# Patient Record
Sex: Female | Born: 1980 | Race: Black or African American | Hispanic: No | Marital: Single | Smoking: Former smoker
Health system: Southern US, Community
[De-identification: ages and names within clinical notes are randomized; demographics above are authoritative.]

## PROBLEM LIST (undated history)

## (undated) DIAGNOSIS — I319 Disease of pericardium, unspecified: Secondary | ICD-10-CM

## (undated) DIAGNOSIS — R51 Headache: Secondary | ICD-10-CM

## (undated) DIAGNOSIS — E119 Type 2 diabetes mellitus without complications: Secondary | ICD-10-CM

## (undated) DIAGNOSIS — G43909 Migraine, unspecified, not intractable, without status migrainosus: Secondary | ICD-10-CM

## (undated) DIAGNOSIS — E7800 Pure hypercholesterolemia, unspecified: Secondary | ICD-10-CM

## (undated) DIAGNOSIS — A419 Sepsis, unspecified organism: Secondary | ICD-10-CM

## (undated) DIAGNOSIS — E282 Polycystic ovarian syndrome: Secondary | ICD-10-CM

## (undated) DIAGNOSIS — L0291 Cutaneous abscess, unspecified: Secondary | ICD-10-CM

## (undated) HISTORY — PX: BIOPSY ENDOMETRIAL: PRO11

## (undated) HISTORY — PX: ABSCESS DRAINAGE: SHX1119

---

## 2006-03-18 ENCOUNTER — Emergency Department (HOSPITAL_COMMUNITY): Admission: EM | Admit: 2006-03-18 | Discharge: 2006-03-18 | Payer: Self-pay | Admitting: Emergency Medicine

## 2006-07-30 ENCOUNTER — Emergency Department (HOSPITAL_COMMUNITY): Admission: EM | Admit: 2006-07-30 | Discharge: 2006-07-30 | Payer: Self-pay | Admitting: Emergency Medicine

## 2007-02-25 ENCOUNTER — Emergency Department (HOSPITAL_COMMUNITY): Admission: EM | Admit: 2007-02-25 | Discharge: 2007-02-25 | Payer: Self-pay | Admitting: Emergency Medicine

## 2007-02-28 ENCOUNTER — Emergency Department (HOSPITAL_COMMUNITY): Admission: EM | Admit: 2007-02-28 | Discharge: 2007-02-28 | Payer: Self-pay | Admitting: Family Medicine

## 2007-05-31 ENCOUNTER — Emergency Department (HOSPITAL_COMMUNITY): Admission: EM | Admit: 2007-05-31 | Discharge: 2007-05-31 | Payer: Self-pay | Admitting: Emergency Medicine

## 2007-11-22 ENCOUNTER — Emergency Department (HOSPITAL_COMMUNITY): Admission: EM | Admit: 2007-11-22 | Discharge: 2007-11-22 | Payer: Self-pay | Admitting: Emergency Medicine

## 2008-01-27 ENCOUNTER — Emergency Department (HOSPITAL_COMMUNITY): Admission: EM | Admit: 2008-01-27 | Discharge: 2008-01-27 | Payer: Self-pay | Admitting: Emergency Medicine

## 2008-07-16 ENCOUNTER — Emergency Department (HOSPITAL_COMMUNITY): Admission: EM | Admit: 2008-07-16 | Discharge: 2008-07-16 | Payer: Self-pay | Admitting: Family Medicine

## 2008-07-17 ENCOUNTER — Inpatient Hospital Stay (HOSPITAL_COMMUNITY): Admission: AD | Admit: 2008-07-17 | Discharge: 2008-07-17 | Payer: Self-pay | Admitting: Family Medicine

## 2008-09-23 ENCOUNTER — Emergency Department (HOSPITAL_COMMUNITY): Admission: EM | Admit: 2008-09-23 | Discharge: 2008-09-23 | Payer: Self-pay | Admitting: Emergency Medicine

## 2008-12-31 ENCOUNTER — Emergency Department (HOSPITAL_COMMUNITY): Admission: EM | Admit: 2008-12-31 | Discharge: 2008-12-31 | Payer: Self-pay | Admitting: Emergency Medicine

## 2009-11-16 ENCOUNTER — Emergency Department (HOSPITAL_COMMUNITY): Admission: EM | Admit: 2009-11-16 | Discharge: 2009-11-17 | Payer: Self-pay | Admitting: Emergency Medicine

## 2009-11-18 ENCOUNTER — Ambulatory Visit (HOSPITAL_COMMUNITY): Admission: RE | Admit: 2009-11-18 | Discharge: 2009-11-18 | Payer: Self-pay | Admitting: Emergency Medicine

## 2009-11-18 ENCOUNTER — Encounter (INDEPENDENT_AMBULATORY_CARE_PROVIDER_SITE_OTHER): Payer: Self-pay | Admitting: Emergency Medicine

## 2009-11-18 ENCOUNTER — Ambulatory Visit: Payer: Self-pay | Admitting: Vascular Surgery

## 2010-07-13 ENCOUNTER — Inpatient Hospital Stay (HOSPITAL_COMMUNITY)
Admission: AD | Admit: 2010-07-13 | Discharge: 2010-07-13 | Disposition: A | Discharge status: Home / Self Care | Payer: Self-pay | Source: Ambulatory Visit | Attending: Family Medicine | Admitting: Family Medicine

## 2010-07-13 DIAGNOSIS — N760 Acute vaginitis: Secondary | ICD-10-CM

## 2010-07-13 DIAGNOSIS — N949 Unspecified condition associated with female genital organs and menstrual cycle: Secondary | ICD-10-CM | POA: Insufficient documentation

## 2010-07-13 DIAGNOSIS — B9689 Other specified bacterial agents as the cause of diseases classified elsewhere: Secondary | ICD-10-CM | POA: Insufficient documentation

## 2010-07-13 DIAGNOSIS — N390 Urinary tract infection, site not specified: Secondary | ICD-10-CM | POA: Insufficient documentation

## 2010-07-13 DIAGNOSIS — N938 Other specified abnormal uterine and vaginal bleeding: Secondary | ICD-10-CM | POA: Insufficient documentation

## 2010-07-13 DIAGNOSIS — A5901 Trichomonal vulvovaginitis: Secondary | ICD-10-CM

## 2010-07-13 DIAGNOSIS — A499 Bacterial infection, unspecified: Secondary | ICD-10-CM

## 2010-07-13 LAB — URINE MICROSCOPIC-ADD ON

## 2010-07-13 LAB — URINALYSIS, ROUTINE W REFLEX MICROSCOPIC
Protein, ur: NEGATIVE mg/dL
Specific Gravity, Urine: >1.03 — ABNORMAL HIGH (ref 1.005–1.030)

## 2010-07-13 LAB — WET PREP, GENITAL

## 2010-07-13 LAB — POCT PREGNANCY, URINE: Preg Test, Ur: NEGATIVE

## 2010-07-15 LAB — URINE CULTURE

## 2010-08-17 LAB — POCT I-STAT, CHEM 8
Chloride: 103 mEq/L (ref 96–112)
HCT: 42 % (ref 36.0–46.0)
Hemoglobin: 14.3 g/dL (ref 12.0–15.0)
Potassium: 4 mEq/L (ref 3.5–5.1)
Sodium: 141 mEq/L (ref 135–145)
TCO2: 30 mmol/L (ref 0–100)

## 2010-08-17 LAB — DIFFERENTIAL
Eosinophils Absolute: 0.2 10*3/uL (ref 0.0–0.7)
Eosinophils Relative: 1 % (ref 0–5)
Lymphocytes Relative: 17 % (ref 12–46)
Lymphs Abs: 2 10*3/uL (ref 0.7–4.0)
Monocytes Relative: 13 % — ABNORMAL HIGH (ref 3–12)

## 2010-08-17 LAB — CBC
Hemoglobin: 12.9 g/dL (ref 12.0–15.0)
MCV: 91.5 fL (ref 78.0–100.0)
RDW: 12.9 % (ref 11.5–15.5)
WBC: 12.2 10*3/uL — ABNORMAL HIGH (ref 4.0–10.5)

## 2010-08-17 LAB — POCT PREGNANCY, URINE: Preg Test, Ur: NEGATIVE

## 2010-08-25 ENCOUNTER — Inpatient Hospital Stay (HOSPITAL_COMMUNITY)
Admission: AD | Admit: 2010-08-25 | Discharge: 2010-08-25 | Disposition: A | Discharge status: Home / Self Care | Payer: Self-pay | Source: Ambulatory Visit | Attending: Obstetrics & Gynecology | Admitting: Obstetrics & Gynecology

## 2010-08-25 DIAGNOSIS — N938 Other specified abnormal uterine and vaginal bleeding: Secondary | ICD-10-CM | POA: Insufficient documentation

## 2010-08-25 DIAGNOSIS — N949 Unspecified condition associated with female genital organs and menstrual cycle: Secondary | ICD-10-CM

## 2010-08-25 LAB — WET PREP, GENITAL: Yeast Wet Prep HPF POC: NONE SEEN

## 2010-08-25 LAB — CBC
HCT: 39.8 % (ref 36.0–46.0)
Hemoglobin: 12.8 g/dL (ref 12.0–15.0)
MCH: 28.5 pg (ref 26.0–34.0)
MCHC: 32.2 g/dL (ref 30.0–36.0)
RBC: 4.49 MIL/uL (ref 3.87–5.11)
RDW: 13.6 % (ref 11.5–15.5)
WBC: 7.4 10*3/uL (ref 4.0–10.5)

## 2010-08-25 LAB — POCT PREGNANCY, URINE: Preg Test, Ur: NEGATIVE

## 2010-08-25 LAB — URINALYSIS, ROUTINE W REFLEX MICROSCOPIC: Leukocytes, UA: NEGATIVE

## 2010-08-25 LAB — SAMPLE TO BLOOD BANK

## 2010-08-25 LAB — URINE MICROSCOPIC-ADD ON

## 2010-08-26 LAB — GC/CHLAMYDIA PROBE AMP, GENITAL: GC Probe Amp, Genital: NEGATIVE

## 2010-09-06 LAB — POCT RAPID STREP A (OFFICE): Streptococcus, Group A Screen (Direct): NEGATIVE

## 2010-09-16 LAB — CBC
Hemoglobin: 12.2 g/dL (ref 12.0–15.0)
RBC: 4.09 MIL/uL (ref 3.87–5.11)
WBC: 6.2 10*3/uL (ref 4.0–10.5)

## 2010-09-16 LAB — WET PREP, GENITAL: Yeast Wet Prep HPF POC: NONE SEEN

## 2010-09-16 LAB — ABO/RH: ABO/RH(D): B POS

## 2010-09-16 LAB — POCT PREGNANCY, URINE: Preg Test, Ur: NEGATIVE

## 2010-09-22 ENCOUNTER — Encounter: Payer: Self-pay | Admitting: Family Medicine

## 2010-11-06 ENCOUNTER — Encounter: Payer: Self-pay | Admitting: Obstetrics & Gynecology

## 2011-02-20 ENCOUNTER — Encounter (HOSPITAL_COMMUNITY): Payer: Self-pay | Admitting: *Deleted

## 2011-02-20 ENCOUNTER — Inpatient Hospital Stay (HOSPITAL_COMMUNITY)
Admission: AD | Admit: 2011-02-20 | Discharge: 2011-02-20 | Disposition: A | Discharge status: Home / Self Care | Payer: Self-pay | Source: Ambulatory Visit | Attending: Obstetrics & Gynecology | Admitting: Obstetrics & Gynecology

## 2011-02-20 DIAGNOSIS — N920 Excessive and frequent menstruation with regular cycle: Secondary | ICD-10-CM | POA: Insufficient documentation

## 2011-02-20 DIAGNOSIS — N921 Excessive and frequent menstruation with irregular cycle: Secondary | ICD-10-CM

## 2011-02-20 HISTORY — DX: Disease of pericardium, unspecified: I31.9

## 2011-02-20 HISTORY — DX: Headache: R51

## 2011-02-20 HISTORY — DX: Polycystic ovarian syndrome: E28.2

## 2011-02-20 LAB — URINALYSIS, ROUTINE W REFLEX MICROSCOPIC
Glucose, UA: NEGATIVE mg/dL
Ketones, ur: NEGATIVE mg/dL
Leukocytes, UA: NEGATIVE
Nitrite: NEGATIVE
pH: 6 (ref 5.0–8.0)

## 2011-02-20 LAB — CBC
HCT: 37.8 % (ref 36.0–46.0)
Hemoglobin: 12.1 g/dL (ref 12.0–15.0)
MCH: 28.3 pg (ref 26.0–34.0)
MCHC: 32 g/dL (ref 30.0–36.0)
RDW: 14.4 % (ref 11.5–15.5)

## 2011-02-20 LAB — URINE MICROSCOPIC-ADD ON

## 2011-02-20 LAB — POCT PREGNANCY, URINE: Preg Test, Ur: NEGATIVE

## 2011-02-20 LAB — WET PREP, GENITAL: Yeast Wet Prep HPF POC: NONE SEEN

## 2011-02-20 MED ORDER — MEDROXYPROGESTERONE ACETATE 10 MG PO TABS: 10.0000 mg | ORAL_TABLET | Freq: Every day | ORAL | Status: DC

## 2011-02-20 NOTE — ED Provider Notes (Signed)
History   Pt presents today c/o vag bleeding x 4 months. She states she has been dealing with irregular vag bleeding her entire life from PCOS. She denies vag irritation, fever, or any other sx.  Chief Complaint  Patient presents with  . Vaginal Bleeding  . Back Pain  . Dizziness   HPI  OB History    Grav Para Term Preterm Abortions TAB SAB Ect Mult Living   0               Past Medical History  Diagnosis Date  . Polycystic disease, ovaries   . Pericarditis   . Asthma   . Headache     Past Surgical History  Procedure Date  . Biopsy endometrial     No family history on file.  History  Substance Use Topics  . Smoking status: Former Smoker -- 10 years    Types: Cigarettes    Quit date: 01/31/2011  . Smokeless tobacco: Not on file  . Alcohol Use: Yes     social    Allergies: No Known Allergies  Prescriptions prior to admission  Medication Sig Dispense Refill  . acetaminophen (TYLENOL) 500 MG tablet Take 1,000 mg by mouth every 6 (six) hours as needed. Pain          Review of Systems  Constitutional: Negative for fever.  Cardiovascular: Negative for chest pain.  Gastrointestinal: Positive for abdominal pain. Negative for nausea, vomiting, diarrhea and constipation.  Genitourinary: Negative for dysuria, urgency, frequency and hematuria.  Neurological: Positive for dizziness. Negative for headaches.  Psychiatric/Behavioral: Negative for depression and suicidal ideas.   Physical Exam   Blood pressure 125/76, pulse 59, temperature 98.4 F (36.9 C), temperature source Oral, resp. rate 20, height 5\' 11"  (1.803 m), weight 324 lb 4 oz (147.079 kg), last menstrual period 10/20/2010.  Physical Exam  Constitutional: She is oriented to person, place, and time. She appears well-developed and well-nourished.  HENT:  Head: Normocephalic and atraumatic.  Eyes: EOM are normal. Pupils are equal, round, and reactive to light.  GI: Soft. She exhibits no distension and no  mass. There is no tenderness. There is no rebound and no guarding.  Genitourinary: Cervix exhibits no motion tenderness, no discharge and no friability. Right adnexum displays no mass, no tenderness and no fullness. Left adnexum displays no mass, no tenderness and no fullness. There is bleeding around the vagina. No vaginal discharge found.       Bimanual exam difficult secondary to increased body habitus.  Neurological: She is alert and oriented to person, place, and time.  Skin: Skin is warm and dry.  Psychiatric: She has a normal mood and affect. Her behavior is normal. Judgment and thought content normal.    MAU Course  Procedures  Wet prep and GC/Chlamydia culture done.  Results for orders placed during the hospital encounter of 02/20/11 (from the past 24 hour(s))  URINALYSIS, ROUTINE W REFLEX MICROSCOPIC     Status: Abnormal   Collection Time   02/20/11  8:42 PM      Component Value Range   Color, Urine YELLOW  YELLOW    Appearance HAZY (*) CLEAR    Specific Gravity, Urine >1.030 (*) 1.005 - 1.030    pH 6.0  5.0 - 8.0    Glucose, UA NEGATIVE  NEGATIVE (mg/dL)   Hgb urine dipstick LARGE (*) NEGATIVE    Bilirubin Urine NEGATIVE  NEGATIVE    Ketones, ur NEGATIVE  NEGATIVE (mg/dL)   Protein, ur NEGATIVE  NEGATIVE (mg/dL)   Urobilinogen, UA 0.2  0.0 - 1.0 (mg/dL)   Nitrite NEGATIVE  NEGATIVE    Leukocytes, UA NEGATIVE  NEGATIVE   URINE MICROSCOPIC-ADD ON     Status: Normal   Collection Time   02/20/11  8:42 PM      Component Value Range   Squamous Epithelial / LPF RARE  RARE    RBC / HPF 11-20  <3 (RBC/hpf)   Bacteria, UA RARE  RARE   POCT PREGNANCY, URINE     Status: Normal   Collection Time   02/20/11  8:57 PM      Component Value Range   Preg Test, Ur NEGATIVE    WET PREP, GENITAL     Status: Abnormal   Collection Time   02/20/11 10:13 PM      Component Value Range   Yeast, Wet Prep NONE SEEN  NONE SEEN    Trich, Wet Prep NONE SEEN  NONE SEEN    Clue Cells, Wet Prep  FEW (*) NONE SEEN    WBC, Wet Prep HPF POC FEW (*) NONE SEEN   CBC     Status: Normal   Collection Time   02/20/11 10:51 PM      Component Value Range   WBC 7.1  4.0 - 10.5 (K/uL)   RBC 4.27  3.87 - 5.11 (MIL/uL)   Hemoglobin 12.1  12.0 - 15.0 (g/dL)   HCT 78.2  95.6 - 21.3 (%)   MCV 88.5  78.0 - 100.0 (fL)   MCH 28.3  26.0 - 34.0 (pg)   MCHC 32.0  30.0 - 36.0 (g/dL)   RDW 08.6  57.8 - 46.9 (%)   Platelets 204  150 - 400 (K/uL)     Assessment and Plan  Menometrorrhagia: discussed with pt at length. Will give Rx for provera x 10 days. She will f/u in the GYN clinic. Discussed diet, activity, risks, and precautions.  Clinton Gallant. Rafan Sanders III, DrHSc, MPAS, PA-C  02/20/2011, 10:22 PM   Henrietta Hoover, PA 02/20/11 2315

## 2011-02-20 NOTE — Progress Notes (Signed)
Pt states, " I've had heavy bleeding for four months, and all the time I pass clots. For the past couple of days I've been light headed and don't have any energy and just want to sleep.  I've had low back pain off and on with pain into my isdes and pelvic area off and on during this four months. I had an appt at [facility] clinic, and missed it and now they can't see me until the end of Oct."

## 2011-02-21 LAB — GC/CHLAMYDIA PROBE AMP, GENITAL
Chlamydia, DNA Probe: NEGATIVE
GC Probe Amp, Genital: NEGATIVE

## 2011-02-26 LAB — POCT URINALYSIS DIP (DEVICE)
Glucose, UA: NEGATIVE
Operator id: 235561
Protein, ur: >=300 — AB
Urobilinogen, UA: 4 — ABNORMAL HIGH

## 2011-02-26 LAB — POCT PREGNANCY, URINE: Operator id: 235561

## 2011-03-12 LAB — CULTURE, ROUTINE-ABSCESS

## 2011-03-13 ENCOUNTER — Encounter (HOSPITAL_COMMUNITY): Payer: Self-pay | Admitting: *Deleted

## 2011-03-13 ENCOUNTER — Inpatient Hospital Stay (HOSPITAL_COMMUNITY)
Admission: AD | Admit: 2011-03-13 | Discharge: 2011-03-13 | Disposition: A | Discharge status: Home / Self Care | Payer: Self-pay | Source: Ambulatory Visit | Attending: Obstetrics and Gynecology | Admitting: Obstetrics and Gynecology

## 2011-03-13 DIAGNOSIS — N938 Other specified abnormal uterine and vaginal bleeding: Secondary | ICD-10-CM | POA: Insufficient documentation

## 2011-03-13 DIAGNOSIS — N949 Unspecified condition associated with female genital organs and menstrual cycle: Secondary | ICD-10-CM | POA: Insufficient documentation

## 2011-03-13 DIAGNOSIS — N390 Urinary tract infection, site not specified: Secondary | ICD-10-CM | POA: Insufficient documentation

## 2011-03-13 LAB — CBC
HCT: 36.7 % (ref 36.0–46.0)
Hemoglobin: 11.9 g/dL — ABNORMAL LOW (ref 12.0–15.0)
MCV: 89.5 fL (ref 78.0–100.0)
Platelets: 199 10*3/uL (ref 150–400)
RBC: 4.1 MIL/uL (ref 3.87–5.11)
WBC: 7.3 10*3/uL (ref 4.0–10.5)

## 2011-03-13 LAB — URINALYSIS, ROUTINE W REFLEX MICROSCOPIC
Glucose, UA: 100 mg/dL — AB
Specific Gravity, Urine: 1.02 (ref 1.005–1.030)
pH: 6.5 (ref 5.0–8.0)

## 2011-03-13 LAB — URINE MICROSCOPIC-ADD ON

## 2011-03-13 MED ORDER — NORGESTIMATE-ETH ESTRADIOL 0.25-35 MG-MCG PO TABS: 1.0000 | ORAL_TABLET | Freq: Every day | ORAL | Status: DC

## 2011-03-13 MED ORDER — SULFAMETHOXAZOLE-TRIMETHOPRIM 800-160 MG PO TABS: 1.0000 | ORAL_TABLET | Freq: Two times a day (BID) | ORAL | Status: AC

## 2011-03-13 NOTE — Progress Notes (Signed)
Pt states she has been seen in MAU for vaginal bleeding, was given pills to take for 5 days and has an appointment in November in the [facility]. States bleeding never completely stopped while taking the pills and is now having increasing bleeding and passing clots. Abdominal pain on the mid right side of the abdomen. Feels weak.

## 2011-03-13 NOTE — Progress Notes (Signed)
BP retaken with appropriate sized cuff - normotensive

## 2011-03-13 NOTE — ED Provider Notes (Signed)
History     Chief Complaint  Patient presents with  . Vaginal Bleeding   HPI  Pt states she has been seen in MAU for vaginal bleeding, was given pills to take for 5 days and has an appointment in November in the [facility]. States bleeding never completely stopped while taking the pills and is now having increasing bleeding and passing clots (plum shaped) since May 2012.  Blood just comes out in the urine.  Sharp intermittent pain on right side that occurs at night or with increased activity.  Reports feeling weak since May as well.  Use tissue as protective factor for underwear.    Past Medical History  Diagnosis Date  . Polycystic disease, ovaries   . Pericarditis   . Asthma   . Headache     Past Surgical History  Procedure Date  . Biopsy endometrial     No family history on file.  History  Substance Use Topics  . Smoking status: Former Smoker -- 10 years    Types: Cigarettes    Quit date: 01/31/2011  . Smokeless tobacco: Not on file  . Alcohol Use: Yes     social    Allergies: No Known Allergies  Prescriptions prior to admission  Medication Sig Dispense Refill  . acetaminophen (TYLENOL) 500 MG tablet Take 1,000 mg by mouth every 6 (six) hours as needed. Pain        . medroxyPROGESTERone (PROVERA) 10 MG tablet Take 1 tablet (10 mg total) by mouth daily.  10 tablet  0    Review of Systems  Constitutional: Positive for malaise/fatigue.  HENT: Negative.   Eyes: Negative.   Respiratory: Negative.   Gastrointestinal: Positive for abdominal pain. Negative for nausea and vomiting.  Genitourinary: Positive for dysuria, urgency, frequency, hematuria and flank pain (right sided).  Neurological: Positive for dizziness.   Physical Exam   Blood pressure 156/92, pulse 79, temperature 99 F (37.2 C), temperature source Oral, resp. rate 20, height 5\' 11"  (1.803 m), weight 146.24 kg (322 lb 6.4 oz), last menstrual period 10/20/2010, SpO2 97.00%.  Physical Exam  MAU  Course  Procedures  CBC Hgb 11.9 UA - +leuk, +nitrites  Assessment and Plan  DUB UTI  Plan: RX Sprintec RX Bactrim  Plan: DC to home Keep scheduled appt in GYN clinic F/U for worsening of symptoms  [facility] 03/13/2011, 2:06 PM

## 2011-03-15 NOTE — ED Provider Notes (Signed)
Agree with above note.  Miranda Stevens 03/15/2011 7:56 AM

## 2011-04-02 ENCOUNTER — Encounter: Payer: Self-pay | Admitting: Obstetrics & Gynecology

## 2011-04-06 ENCOUNTER — Ambulatory Visit (INDEPENDENT_AMBULATORY_CARE_PROVIDER_SITE_OTHER): Payer: Self-pay | Admitting: Obstetrics and Gynecology

## 2011-04-06 ENCOUNTER — Encounter: Payer: Self-pay | Admitting: Obstetrics and Gynecology

## 2011-04-06 VITALS — BP 136/90 | HR 78 | Temp 99.3°F | Ht 72.0 in | Wt 319.8 lb

## 2011-04-06 DIAGNOSIS — N920 Excessive and frequent menstruation with regular cycle: Secondary | ICD-10-CM

## 2011-04-06 DIAGNOSIS — N921 Excessive and frequent menstruation with irregular cycle: Secondary | ICD-10-CM

## 2011-04-06 MED ORDER — NORGESTIMATE-ETH ESTRADIOL 0.25-35 MG-MCG PO TABS: ORAL_TABLET | ORAL | Status: DC

## 2011-04-06 NOTE — Progress Notes (Signed)
S: Pt presents today to f/u on menometrorrhagia. She has been seen twice for the same in the MAU. Initially she was given provera which worked for several days but she started bleeding again. She then returned to the MAU and was started on Sprintec. She states she has continued to have bleeding daily. However, she is now in the last week of her pills when she should normally have menses. She denies severe abd pain, CP, fever, vag dc, or any other sx at this time. O: VSS afebrile. A&Ox3 in NAD Abd morbidly obese, soft, nontender to palpation. NL external genitalia. Moderate amount of vag bleeding noted. Bimanual exam is difficult secondary to morbid obesity. No obvious masses. A/P: Menometrorrhagia: discussed with pt at length. She is to continue her Sprintec. Will schedule pelvic US and given her obesity, will schedule endometrial biopsy. Discussed diet, activity, risks, and precautions.  Clinton Gallant. Rice III, DrHSc, MPAS, PA-C

## 2011-04-09 ENCOUNTER — Ambulatory Visit (HOSPITAL_COMMUNITY)
Admission: RE | Admit: 2011-04-09 | Discharge: 2011-04-09 | Disposition: A | Discharge status: Home / Self Care | Payer: Self-pay | Source: Ambulatory Visit | Attending: Obstetrics and Gynecology | Admitting: Obstetrics and Gynecology

## 2011-04-09 DIAGNOSIS — N921 Excessive and frequent menstruation with irregular cycle: Secondary | ICD-10-CM

## 2011-04-09 DIAGNOSIS — M549 Dorsalgia, unspecified: Secondary | ICD-10-CM | POA: Insufficient documentation

## 2011-04-09 DIAGNOSIS — N920 Excessive and frequent menstruation with regular cycle: Secondary | ICD-10-CM | POA: Insufficient documentation

## 2011-04-09 DIAGNOSIS — N949 Unspecified condition associated with female genital organs and menstrual cycle: Secondary | ICD-10-CM | POA: Insufficient documentation

## 2011-04-14 ENCOUNTER — Telehealth: Payer: Self-pay | Admitting: *Deleted

## 2011-04-14 NOTE — Telephone Encounter (Signed)
Pt left message requesting results of [place]

## 2011-04-14 NOTE — Telephone Encounter (Signed)
I called pt and left message that if there are abnormal findings of an urgent nature, she will receive a phone call to that regard. Otherwise, the results will be discussed in detail @ her next appt on 05/17/11 @ 1515.

## 2011-05-07 ENCOUNTER — Other Ambulatory Visit: Payer: Self-pay | Admitting: Family

## 2011-05-18 ENCOUNTER — Encounter: Payer: Self-pay | Admitting: Obstetrics and Gynecology

## 2011-09-03 ENCOUNTER — Telehealth: Payer: Self-pay | Admitting: *Deleted

## 2011-09-03 NOTE — Telephone Encounter (Signed)
Pt left message requesting refill of Sprintec.  **Note: pt was last given Rx on 04/06/11 @ MAU and only 1 refill. She has not been seen in our clinic- has several DNKA's. She has appt scheduled 09/30/11 to review birth control.

## 2011-09-09 NOTE — Telephone Encounter (Signed)
I contacted pt and discussed her refill request. I stated that she had been given Rx for 1 pack of pills on 04/06/11 (MAU visit) and only 1 refill. I asked how long it has been since she has taken the pills. Pt replied "January". I explained that we cannot refill her Rx because there has been a lapse in the administration and we have not seen her yet in the clinic for evaluation. (pt had 2 missed appts in December)  I further explained that we would need to verify that she is not pregnant, has normal BP and confirm that the OCP's are a good plan of care prior to re-ordering. Pt responded that she understood why she could not get the refill @ this time. While we were talking, the connection was lost. I called pt back and left message of re-cap of our discussion. She should keep a record of any abnormal bleeding between now and appt on 09/30/11 and discuss with provider @ appt.

## 2011-09-30 ENCOUNTER — Ambulatory Visit: Payer: Self-pay | Admitting: Physician Assistant

## 2011-11-02 ENCOUNTER — Other Ambulatory Visit: Payer: Self-pay | Admitting: Family

## 2012-03-14 ENCOUNTER — Encounter (HOSPITAL_COMMUNITY): Payer: Self-pay

## 2012-03-14 ENCOUNTER — Inpatient Hospital Stay (HOSPITAL_COMMUNITY)
Admission: AD | Admit: 2012-03-14 | Discharge: 2012-03-15 | Disposition: A | Discharge status: Home / Self Care | Payer: Self-pay | Source: Ambulatory Visit | Attending: Obstetrics and Gynecology | Admitting: Obstetrics and Gynecology

## 2012-03-14 DIAGNOSIS — N949 Unspecified condition associated with female genital organs and menstrual cycle: Secondary | ICD-10-CM | POA: Insufficient documentation

## 2012-03-14 DIAGNOSIS — N939 Abnormal uterine and vaginal bleeding, unspecified: Secondary | ICD-10-CM

## 2012-03-14 DIAGNOSIS — N898 Other specified noninflammatory disorders of vagina: Secondary | ICD-10-CM

## 2012-03-14 DIAGNOSIS — B372 Candidiasis of skin and nail: Secondary | ICD-10-CM | POA: Insufficient documentation

## 2012-03-14 DIAGNOSIS — N938 Other specified abnormal uterine and vaginal bleeding: Secondary | ICD-10-CM | POA: Insufficient documentation

## 2012-03-14 DIAGNOSIS — F329 Major depressive disorder, single episode, unspecified: Secondary | ICD-10-CM

## 2012-03-14 DIAGNOSIS — E282 Polycystic ovarian syndrome: Secondary | ICD-10-CM | POA: Insufficient documentation

## 2012-03-14 LAB — CBC
HCT: 35.1 % — ABNORMAL LOW (ref 36.0–46.0)
MCHC: 32.5 g/dL (ref 30.0–36.0)
MCV: 88.6 fL (ref 78.0–100.0)
RDW: 13 % (ref 11.5–15.5)
WBC: 7.3 10*3/uL (ref 4.0–10.5)

## 2012-03-14 LAB — WET PREP, GENITAL: Trich, Wet Prep: NONE SEEN

## 2012-03-14 LAB — COMPREHENSIVE METABOLIC PANEL
Albumin: 3.4 g/dL — ABNORMAL LOW (ref 3.5–5.2)
BUN: 11 mg/dL (ref 6–23)
Chloride: 101 mEq/L (ref 96–112)
Creatinine, Ser: 1.06 mg/dL (ref 0.50–1.10)
Total Bilirubin: 0.1 mg/dL — ABNORMAL LOW (ref 0.3–1.2)

## 2012-03-14 MED ORDER — MEGESTROL ACETATE 20 MG PO TABS: 20.0000 mg | ORAL_TABLET | Freq: Every day | ORAL | Status: DC

## 2012-03-14 NOTE — MAU Provider Note (Signed)
History     CSN: [id number]  Arrival date and time: 03/14/12 2147   None     Chief Complaint  Patient presents with  . Vaginal Bleeding   HPI Miranda Stevens is a 31 y.o. female who presents to MAU with ? Reaction to medication. She reports that she was living in [place] the past 3 months and went to the ED there for vaginal bleeding. Was started on Provera 10 mg daily, took for 7 days but continued to bleed so stopped the medicine. Went back to the ED and had ultrasound, blood work and told patient everything was normal. Gave hydrocodone for the pain and told to continue Provera. Patient came back to [facility] 2 days ago and has continued to have vaginal bleeding. Took hydrocodone tonight at 6 pm and after that felt "crazy", nausea, headache, couldn't sleep, felt short of breath. Feeling better now but still has headache. Had same reaction when in the ER and they gave 2 hydrocodone pills. Family went to [facility] and got patient because she has a history of depression. Has an appointment for endometrial biopsy for November 4th. Hx of PCOS. The history was provided by the patient.  OB History    Grav Para Term Preterm Abortions TAB SAB Ect Mult Living   0         0      Past Medical History  Diagnosis Date  . Polycystic disease, ovaries   . Pericarditis   . Asthma   . Headache     Past Surgical History  Procedure Date  . Biopsy endometrial     Family History  Problem Relation Age of Onset  . Lung disease Mother   . Diabetes Mother   . Heart disease Mother   . Hyperlipidemia Father     History  Substance Use Topics  . Smoking status: Former Smoker -- 10 years    Types: Cigarettes    Quit date: 01/31/2011  . Smokeless tobacco: Not on file  . Alcohol Use: Yes     social    Allergies: No Known Allergies  Prescriptions prior to admission  Medication Sig Dispense Refill  . acetaminophen (TYLENOL) 500 MG tablet Take 1,000 mg by mouth every 6 (six) hours as needed.  Pain        . norgestimate-ethinyl estradiol (ORTHO-CYCLEN,SPRINTEC,PREVIFEM) 0.25-35 MG-MCG tablet Take one pill by mouth daily.  1 Package  1    Review of Systems  Constitutional: Negative for fever, chills and weight loss.  HENT: Positive for ear pain and sore throat (x 2 weeks). Negative for nosebleeds, congestion and neck pain.   Eyes: Negative for blurred vision, double vision, photophobia and pain.  Respiratory: Positive for shortness of breath (off and on for 3 weeks). Negative for cough and wheezing.   Cardiovascular: Negative for chest pain, palpitations and leg swelling.  Gastrointestinal: Positive for nausea and abdominal pain. Negative for heartburn, vomiting, diarrhea and constipation.  Genitourinary: Positive for frequency. Negative for dysuria and urgency.  Musculoskeletal: Positive for back pain. Negative for myalgias.  Skin: Negative for itching and rash.  Neurological: Positive for dizziness and headaches. Negative for sensory change, speech change, seizures and weakness.  Endo/Heme/Allergies: Does not bruise/bleed easily.  Psychiatric/Behavioral: Positive for depression. The patient has insomnia. The patient is not nervous/anxious.    Physical Exam   Blood pressure 125/54, pulse 68, temperature 97.4 F (36.3 C), temperature source Oral, resp. rate 20, height 6' (1.829 m), weight 318 lb (144.244 kg), last  menstrual period 02/13/2012, SpO2 100.00%.  Physical Exam  Nursing note and vitals reviewed. Constitutional: She is oriented to person, place, and time. She appears well-developed and well-nourished. No distress.  HENT:  Head: Normocephalic and atraumatic.  Mouth/Throat: Uvula is midline and mucous membranes are normal. No oropharyngeal exudate or posterior oropharyngeal erythema.  Eyes: EOM are normal.  Neck: Neck supple.  Cardiovascular: Normal rate, regular rhythm and normal heart sounds.   No murmur heard. Respiratory: Effort normal. She has no wheezes. She  has no rales. She exhibits no tenderness.  GI: Soft. There is no tenderness.       Area under panus consistent with monilia.  Genitourinary:       External genitalia without lesions. Moderate blood vaginal vault. No CMT, no adnexal tenderness, unable to palpate uterus due to patient habitus.  Musculoskeletal: Normal range of motion.  Neurological: She is alert and oriented to person, place, and time.  Skin: Skin is warm and dry.  Psychiatric: Her behavior is normal. Judgment and thought content normal. She exhibits a depressed mood.       tearful   Results for orders placed during the hospital encounter of 03/14/12 (from the past 24 hour(s))  CBC     Status: Abnormal   Collection Time   03/14/12 10:30 PM      Component Value Range   WBC 7.3  4.0 - 10.5 K/uL   RBC 3.96  3.87 - 5.11 MIL/uL   Hemoglobin 11.4 (*) 12.0 - 15.0 g/dL   HCT 16.1 (*) 09.6 - 04.5 %   MCV 88.6  78.0 - 100.0 fL   MCH 28.8  26.0 - 34.0 pg   MCHC 32.5  30.0 - 36.0 g/dL   RDW 40.9  81.1 - 91.4 %   Platelets 195  150 - 400 K/uL  COMPREHENSIVE METABOLIC PANEL     Status: Abnormal   Collection Time   03/14/12 10:30 PM      Component Value Range   Sodium 137  135 - 145 mEq/L   Potassium 3.9  3.5 - 5.1 mEq/L   Chloride 101  96 - 112 mEq/L   CO2 27  19 - 32 mEq/L   Glucose, Bld 96  70 - 99 mg/dL   BUN 11  6 - 23 mg/dL   Creatinine, Ser 7.82  0.50 - 1.10 mg/dL   Calcium 8.5  8.4 - 95.6 mg/dL   Total Protein 6.7  6.0 - 8.3 g/dL   Albumin 3.4 (*) 3.5 - 5.2 g/dL   AST 14  0 - 37 U/L   ALT 19  0 - 35 U/L   Alkaline Phosphatase 79  39 - 117 U/L   Total Bilirubin 0.1 (*) 0.3 - 1.2 mg/dL   GFR calc non Af Amer 70 (*) >90 mL/min   GFR calc Af Amer 81 (*) >90 mL/min   Discussed with Dr. Jolayne Panther and she does not feel we should start antidepressants. Will discuss follow up with Mental Health or [facility] with patient.   Assessment: 31 y.o. female with abnormal vaginal bleeding   PCOS   Monilia under panus of  abdomen  Plan:  Megace   Treat yeast   Follow up with GYN Clinic   Go to [facility] if Depressions worsens. Discussed with the patient and all questioned fully answered.    Medication List     As of 03/15/2012 12:07 AM    START taking these medications  megestrol 20 MG tablet   Commonly known as: MEGACE   Take 1 tablet (20 mg total) by mouth daily.      triamcinolone cream 0.1 %   Commonly known as: KENALOG   Apply topically 2 (two) times daily. Apply to affected area      CONTINUE taking these medications         acetaminophen 500 MG tablet   Commonly known as: TYLENOL      STOP taking these medications         norgestimate-ethinyl estradiol 0.25-35 MG-MCG tablet   Commonly known as: ORTHO-CYCLEN,SPRINTEC,PREVIFEM          Where to get your medications    These are the prescriptions that you need to pick up.   You may get these medications from any pharmacy.         megestrol 20 MG tablet   triamcinolone cream 0.1 %            MAU Course  Procedures  NEESE,HOPE, RN, FNP, BC 03/14/2012, 10:10 PM

## 2012-03-14 NOTE — MAU Note (Signed)
Pt reports she has had PCOS x yrs, has been bleeding since 09/14. Cramping off/on. Has been in [place] and evaluated x 2 there and was on provera x 10 days with no change, then was given oxycodone and does not like the way it makes her feel. States she is having cramping.

## 2012-03-15 LAB — GC/CHLAMYDIA PROBE AMP, GENITAL
Chlamydia, DNA Probe: NEGATIVE
GC Probe Amp, Genital: NEGATIVE

## 2012-03-15 MED ORDER — TRIAMCINOLONE ACETONIDE 0.1 % EX CREA: TOPICAL_CREAM | Freq: Two times a day (BID) | CUTANEOUS | Status: DC

## 2012-03-15 NOTE — MAU Provider Note (Signed)
Attestation of Attending Supervision of Advanced Practitioner (CNM/NP): Evaluation and management procedures were performed by the Advanced Practitioner under my supervision and collaboration.  I have reviewed the Advanced Practitioner's note and chart, and I agree with the management and plan.  Haron Beilke 03/15/2012 5:09 AM

## 2012-04-01 ENCOUNTER — Encounter (HOSPITAL_COMMUNITY): Payer: Self-pay | Admitting: Emergency Medicine

## 2012-04-01 ENCOUNTER — Emergency Department (INDEPENDENT_AMBULATORY_CARE_PROVIDER_SITE_OTHER)
Admission: EM | Admit: 2012-04-01 | Discharge: 2012-04-01 | Disposition: A | Discharge status: Home / Self Care | Payer: Self-pay | Source: Home / Self Care | Attending: Emergency Medicine | Admitting: Emergency Medicine

## 2012-04-01 DIAGNOSIS — G43909 Migraine, unspecified, not intractable, without status migrainosus: Secondary | ICD-10-CM

## 2012-04-01 MED ORDER — DIPHENHYDRAMINE HCL 50 MG/ML IJ SOLN
INTRAMUSCULAR | Status: AC
  Filled 2012-04-01: qty 1

## 2012-04-01 MED ORDER — DIPHENHYDRAMINE HCL 50 MG/ML IJ SOLN
25.0000 mg | Freq: Once | INTRAMUSCULAR | Status: AC
  Administered 2012-04-01: 25 mg via INTRAMUSCULAR

## 2012-04-01 MED ORDER — KETOROLAC TROMETHAMINE 60 MG/2ML IM SOLN
INTRAMUSCULAR | Status: AC
  Filled 2012-04-01: qty 2

## 2012-04-01 MED ORDER — DEXAMETHASONE SODIUM PHOSPHATE 10 MG/ML IJ SOLN
10.0000 mg | Freq: Once | INTRAMUSCULAR | Status: AC
  Administered 2012-04-01: 10 mg via INTRAMUSCULAR

## 2012-04-01 MED ORDER — DIPHENHYDRAMINE HCL 25 MG PO CAPS
ORAL_CAPSULE | ORAL | Status: AC
  Filled 2012-04-01: qty 1

## 2012-04-01 MED ORDER — KETOROLAC TROMETHAMINE 60 MG/2ML IM SOLN
60.0000 mg | Freq: Once | INTRAMUSCULAR | Status: AC
  Administered 2012-04-01: 60 mg via INTRAMUSCULAR

## 2012-04-01 MED ORDER — DEXAMETHASONE SODIUM PHOSPHATE 10 MG/ML IJ SOLN
INTRAMUSCULAR | Status: AC
  Filled 2012-04-01: qty 1

## 2012-04-01 MED ORDER — SUMATRIPTAN SUCCINATE 50 MG PO TABS: 50.0000 mg | ORAL_TABLET | ORAL | Status: DC | PRN

## 2012-04-01 NOTE — ED Provider Notes (Signed)
Chief Complaint  Patient presents with  . Migraine    History of Present Illness:   The patient is a 31 year old female who presents tonight with a three-day history of a migraine headache. This involves the entire head and involving the cranium, face, and even on down and to the neck. And she's had nausea but no vomiting, photophobia, phonophobia, malaise, fatigue, and dizziness. The pain is worse with any type of movement and better if she lies completely still. She has had similar headaches for years although never been formally diagnosed as having migraine headaches. She's never seen a physician for them and never taken any prescription medication. She usually just takes over-the-counter meds and this helps. This time the over-the-counter meds didn't help. She denies any fever, chills, back pain, or visual symptoms. She's had no paresthesias, localized muscle weakness, difficulty with speech, swallowing, or ambulation.  Review of Systems:  Other than noted above, the patient denies any of the following symptoms: Systemic:  No fever, chills, fatigue, photophobia, stiff neck. Eye:  No redness, eye pain, discharge, blurred vision, or diplopia. ENT:  No nasal congestion, rhinorrhea, sinus pressure or pain, sneezing, earache, or sore throat.  No jaw claudication. Neuro:  No paresthesias, loss of consciousness, seizure activity, muscle weakness, trouble with coordination or gait, trouble speaking or swallowing. Psych:  No depression, anxiety or trouble sleeping.  PMFSH:  Past medical history, family history, social history, meds, and allergies were reviewed.  Physical Exam:   Vital signs:  BP 122/74  Pulse 64  Temp 98.3 F (36.8 C) (Oral)  Resp 20  LMP 02/13/2012 General:  Alert and oriented. She is lying curled up on a stretcher in a darkened room, and appears uncomfortable. Eye:  Lids and conjunctivas normal.  PERRL,  Full EOMs.  Fundi benign with normal discs and vessels. ENT:  No cranial  or facial tenderness to palpation.  TMs and canals clear.  Nasal mucosa was normal and uncongested without any drainage. No intra oral lesions, pharynx clear, mucous membranes moist, dentition normal. Neck:  Supple, full ROM, no tenderness to palpation.  No adenopathy or mass. Neuro:  Alert and orented times 3.  Speech was clear, fluent, and appropriate.  Cranial nerves intact. No pronator drift, muscle strength normal. Finger to nose normal.  DTRs were 2+ and symmetrical.Station and gait were normal.  Romberg's sign was normal.  Able to perform tandem gait well. Psych:  Normal affect.  Medications given in [facility]:  She was given Decadron 10 mg IM, Toradol 60 mg IM, and Benadryl 25 mg IM. She tolerated these well without any immediate side effects.  Assessment:  The encounter diagnosis was Migraine headache.  Plan:   1.  The following meds were prescribed:   New Prescriptions   SUMATRIPTAN (IMITREX) 50 MG TABLET    Take 1 tablet (50 mg total) by mouth every 2 (two) hours as needed for migraine.   2.  The patient was instructed in symptomatic care and handouts were given. 3.  The patient was told to return if becoming worse in any way, if no better in 3 or 4 days, and given some red flag symptoms that would indicate earlier return.    Reuben Likes, MD 04/01/12 2137

## 2012-04-01 NOTE — ED Notes (Signed)
Reports headache for three days now.  Tried taking OTC and Megestrol but relief ease off and pain came back. Does have nasal congestion.  Took sinus medication but no relief

## 2012-04-04 ENCOUNTER — Ambulatory Visit (INDEPENDENT_AMBULATORY_CARE_PROVIDER_SITE_OTHER): Payer: Self-pay | Admitting: Obstetrics & Gynecology

## 2012-04-04 ENCOUNTER — Encounter: Payer: Self-pay | Admitting: Obstetrics & Gynecology

## 2012-04-04 VITALS — BP 99/62 | HR 76 | Temp 98.2°F | Ht 72.0 in | Wt 318.3 lb

## 2012-04-04 DIAGNOSIS — N920 Excessive and frequent menstruation with regular cycle: Secondary | ICD-10-CM | POA: Insufficient documentation

## 2012-04-04 DIAGNOSIS — Z3009 Encounter for other general counseling and advice on contraception: Secondary | ICD-10-CM

## 2012-04-04 MED ORDER — NORETHINDRONE-MESTRANOL 1-50 MG-MCG PO TABS: 1.0000 | ORAL_TABLET | Freq: Every day | ORAL | Status: DC

## 2012-04-04 NOTE — Progress Notes (Signed)
Patient ID: Miranda Stevens, female   DOB: 10/30/1980, 31 y.o.   MRN: [id number]  Chief Complaint  Patient presents with  . Menorrhagia    HPI Miranda Stevens is a 31 y.o. female.  H/O Irregular frequent menses, now stopped after taking Megace. 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 HPI  Past Medical History  Diagnosis Date  . Polycystic disease, ovaries   . Pericarditis   . Asthma   . Headache     Past Surgical History  Procedure Date  . Biopsy endometrial     Family History  Problem Relation Age of Onset  . Lung disease Mother   . Diabetes Mother   . Heart disease Mother   . Hyperlipidemia Father     Social History History  Substance Use Topics  . Smoking status: Former Smoker -- 10 years    Types: Cigarettes    Quit date: 01/31/2011  . Smokeless tobacco: Not on file  . Alcohol Use: Yes     Comment: social    No Known Allergies  Current Outpatient Prescriptions  Medication Sig Dispense Refill  . acetaminophen (TYLENOL) 500 MG tablet Take 1,000 mg by mouth every 6 (six) hours as needed. Pain        . megestrol (MEGACE) 20 MG tablet Take 1 tablet (20 mg total) by mouth daily.  40 tablet  0  . Norethindrone-Mestranol (NECON) 1-50 MG-MCG tablet Take 1 tablet by mouth daily.  1 Package  11  . SUMAtriptan (IMITREX) 50 MG tablet Take 1 tablet (50 mg total) by mouth every 2 (two) hours as needed for migraine.  10 tablet  0  . triamcinolone cream (KENALOG) 0.1 % Apply topically 2 (two) times daily. Apply to affected area  30 g  0    Review of Systems Review of Systems  Constitutional: Positive for fatigue.  Respiratory: Negative.   Genitourinary: Negative for vaginal bleeding.    Blood pressure 99/62, pulse 76, temperature 98.2 F (36.8 C), temperature source Oral, height 6' (1.829 m), weight 318 lb 4.8 oz (144.38 kg), last menstrual period 02/13/2012.  Physical Exam Physical Exam  Constitutional: She is oriented to person, place, and time. No distress.        obese  Pulmonary/Chest: Effort normal. No respiratory distress.  Genitourinary: Vagina normal and uterus normal. No vaginal discharge found.       Pap done, no mass, not tender  Neurological: She is alert and oriented to person, place, and time.  Skin: Skin is warm and dry. No pallor.  Psychiatric: She has a normal mood and affect. Her behavior is normal.    Data Reviewed  *RADIOLOGY REPORT*  Clinical Data: Menometrorrhagia. Heavy vaginal bleeding with  pelvic and back pain. LMP 10/09/2010  TRANSABDOMINAL AND TRANSVAGINAL ULTRASOUND OF PELVIS  Technique: Both transabdominal and transvaginal ultrasound  examinations of the pelvis were performed. Transabdominal technique  was performed for global imaging of the pelvis including uterus,  ovaries, adnexal regions, and pelvic cul-de-sac.  Comparison: 07/17/2008  It was necessary to proceed with endovaginal exam following the  transabdominal exam to visualize the myometrium, endometrium and  adnexa.  Findings:  Uterus: Demonstrates a sagittal length of 6.8 cm, an AP depth of  3.6 cm and a transverse width of 4.3 cm. A homogeneous uterine  myometrium is seen.  Endometrium: Appears homogeneously echogenic with an AP width of 6  mm. No areas of focal thickening or heterogeneity are identified.  Right ovary: Has a  normal appearance measuring 2.6 x 2.3 x 2.1 cm  Left ovary: Has a normal appearance measuring 2.6 x 2.0 x 2.0 cm  Other findings: No pelvic fluid or separate adnexal masses are  seen.  IMPRESSION:  Normal pelvic ultrasound with no focal abnormality identified.  Original Report Authenticated By: Bertha Stakes, M.D.        Assessment    DUB, currently controlled on pregestin    Plan    OCP rx given Ortho Novum 1/50 RTC 3 months       ARNOLD,JAMES 04/04/2012, 4:02 PM

## 2012-04-04 NOTE — Patient Instructions (Signed)

## 2012-05-30 ENCOUNTER — Telehealth: Payer: Self-pay | Admitting: *Deleted

## 2012-05-30 NOTE — Telephone Encounter (Signed)
Spoke with Miranda Stevens and she advised that patient needs to be seen to discuss other options for bleeding. She can go ahead and start next pack of pills but should make an appointment soon. Called patient and left her a message to call us back.

## 2012-05-30 NOTE — Telephone Encounter (Signed)
Pt was switched to Necon from Sprintec at her last visit with Dr. Debroah Loop. She has been bleeding since that visit and is now about to start her third package of pills. She thinks that the pills are not strong enough and would like to get something to stop her bleeding. She used Megace once before and states that it did help with bleeding.

## 2012-06-02 NOTE — Telephone Encounter (Signed)
Called pt at [phone] (H) and was unable to leave message due to voicemail box not being activated.  Called [phone] (M) and left message to return our call to the clinics.

## 2012-06-02 NOTE — Telephone Encounter (Signed)
Pt returned the call and I informed her what is stated below about scheduling another appt to discuss a different option to control bleeding.  Pt stated that she has an appt scheduled for 06/20/12 @ 215 pm.  I advised pt that appt should be great to write down any questions that may have of other options and to make sure she continues to take pills as instructed by the provider.  Pt stated understanding and did not have any other questions.

## 2012-06-02 NOTE — Telephone Encounter (Signed)
Pt left a message stating that she is returning our call.  

## 2012-06-20 ENCOUNTER — Ambulatory Visit (INDEPENDENT_AMBULATORY_CARE_PROVIDER_SITE_OTHER): Payer: Self-pay | Admitting: Obstetrics & Gynecology

## 2012-06-20 VITALS — BP 129/89 | HR 63 | Temp 97.1°F | Ht 72.0 in | Wt 325.3 lb

## 2012-06-20 DIAGNOSIS — Z8742 Personal history of other diseases of the female genital tract: Secondary | ICD-10-CM

## 2012-06-20 MED ORDER — METFORMIN HCL 500 MG PO TABS
500.0000 mg | ORAL_TABLET | Freq: Two times a day (BID) | ORAL | Status: DC
Start: 2012-06-20 — End: 2018-02-24

## 2012-06-20 NOTE — Patient Instructions (Signed)
Polycystic Ovarian Syndrome Polycystic ovarian syndrome is a condition with a number of problems. One problem is with the ovaries. The ovaries are organs located in the female pelvis, on each side of the uterus. Usually, during the menstrual cycle, an egg is released from 1 ovary every month. This is called ovulation. When the egg is fertilized, it goes into the womb (uterus), which allows for the growth of a baby. The egg travels from the ovary through the fallopian tube to the uterus. The ovaries also make the hormones estrogen and progesterone. These hormones help the development of a woman's breasts, body shape, and body hair. They also regulate the menstrual cycle and pregnancy. Sometimes, cysts form in the ovaries. A cyst is a fluid-filled sac. On the ovary, different types of cysts can form. The most common type of ovarian cyst is called a functional or ovulation cyst. It is normal, and often forms during the normal menstrual cycle. Each month, a woman's ovaries grow tiny cysts that hold the eggs. When an egg is fully grown, the sac breaks open. This releases the egg. Then, the sac which released the egg from the ovary dissolves. In one type of functional cyst, called a follicle cyst, the sac does not break open to release the egg. It may actually continue to grow. This type of cyst usually disappears within 1 to 3 months.  One type of cyst problem with the ovaries is called Polycystic Ovarian Syndrome (PCOS). In this condition, many follicle cysts form, but do not rupture and produce an egg. This health problem can affect the following:  Menstrual cycle.  Heart.  Obesity.  Cancer of the uterus.  Fertility.  Blood vessels.  Hair growth (face and body) or baldness.  Hormones.  Appearance.  High blood pressure.  Stroke.  Insulin production.  Inflammation of the liver.  Elevated blood cholesterol and triglycerides. CAUSES   No one knows the exact cause of PCOS.  Women with  PCOS often have a mother or sister with PCOS. There is not yet enough proof to say this is inherited.  Many women with PCOS have a weight problem.  Researchers are looking at the relationship between PCOS and the body's ability to make insulin. Insulin is a hormone that regulates the change of sugar, starches, and other food into energy for the body's use, or for storage. Some women with PCOS make too much insulin. It is possible that the ovaries react by making too many female hormones, called androgens. This can lead to acne, excessive hair growth, weight gain, and ovulation problems.  Too much production of luteinizing hormone (LH) from the pituitary gland in the brain stimulates the ovary to produce too much female hormone (androgen). SYMPTOMS   Infrequent or no menstrual periods, and/or irregular bleeding.  Inability to get pregnant (infertility), because of not ovulating.  Increased growth of hair on the face, chest, stomach, back, thumbs, thighs, or toes.  Acne, oily skin, or dandruff.  Pelvic pain.  Weight gain or obesity, usually carrying extra weight around the waist.  Type 2 diabetes (this is the diabetes that usually does not need insulin).  High cholesterol.  High blood pressure.  Female-pattern baldness or thinning hair.  Patches of thickened and dark brown or black skin on the neck, arms, breasts, or thighs.  Skin tags, or tiny excess flaps of skin, in the armpits or neck area.  Sleep apnea (excessive snoring and breathing stops at times while asleep).  Deepening of the voice.    Gestational diabetes when pregnant.  Increased risk of miscarriage with pregnancy. DIAGNOSIS  There is no single test to diagnose PCOS.   Your caregiver will:  Take a medical history.  Perform a pelvic exam.  Perform an ultrasound.  Check your female and female hormone levels.  Measure glucose or sugar levels in the blood.  Do other blood tests.  If you are producing too many  female hormones, your caregiver will make sure it is from PCOS. At the physical exam, your caregiver will want to evaluate the areas of increased hair growth. Try to allow natural hair growth for a few days before the visit.  During a pelvic exam, the ovaries may be enlarged or swollen by the increased number of small cysts. This can be seen more easily by vaginal ultrasound or screening, to examine the ovaries and lining of the uterus (endometrium) for cysts. The uterine lining may become thicker, if there has not been a regular period. TREATMENT  Because there is no cure for PCOS, it needs to be managed to prevent problems. Treatments are based on your symptoms. Treatment is also based on whether you want to have a baby or whether you need contraception.  Treatment may include:  Progesterone hormone, to start a menstrual period.  Birth control pills, to make you have regular menstrual periods.  Medicines to make you ovulate, if you want to get pregnant.  Medicines to control your insulin.  Medicine to control your blood pressure.  Medicine and diet, to control your high cholesterol and triglycerides in your blood.  Surgery, making small holes in the ovary, to decrease the amount of female hormone production. This is done through a long, lighted tube (laparoscope), placed into the pelvis through a tiny incision in the lower abdomen. Your caregiver will go over some of the choices with you. WOMEN WITH PCOS HAVE THESE CHARACTERISTICS:  High levels of female hormones called androgens.  An irregular or no menstrual cycle.  May have many small cysts in their ovaries. PCOS is the most common hormonal reproductive problem in women of childbearing age. WHY DO WOMEN WITH PCOS HAVE TROUBLE WITH THEIR MENSTRUAL CYCLE? Each month, about 20 eggs start to mature in the ovaries. As one egg grows and matures, the follicle breaks open to release the egg, so it can travel through the fallopian tube for  fertilization. When the single egg leaves the follicle, ovulation takes place. In women with PCOS, the ovary does not make all of the hormones it needs for any of the eggs to fully mature. They may start to grow and accumulate fluid, but no one egg becomes large enough. Instead, some may remain as cysts. Since no egg matures or is released, ovulation does not occur and the hormone progesterone is not made. Without progesterone, a woman's menstrual cycle is irregular or absent. Also, the cysts produce female hormones, which continue to prevent ovulation.  Document Released: 09/11/2004 Document Revised: 08/10/2011 Document Reviewed: 04/05/2009 [employer] Patient Information 2013 [employer].  

## 2012-06-20 NOTE — Progress Notes (Signed)
  Subjective:    Patient ID: Miranda Stevens, female    DOB: December 30, 1980, 32 y.o.   MRN: [id number]  HPINo LMP recorded. G0P0 She took her Necon for about 6 weeks and she bled continuously. She now wants to try metformin for PCOS.  Past Medical History  Diagnosis Date  . Polycystic disease, ovaries   . Pericarditis   . Asthma   . Headache    Past Surgical History  Procedure Date  . Biopsy endometrial     Family History  Problem Relation Age of Onset  . Lung disease Mother   . Diabetes Mother   . Heart disease Mother   . Hyperlipidemia Father       Review of Systems  Genitourinary: Positive for menstrual problem and pelvic pain. Negative for vaginal bleeding, vaginal discharge and vaginal pain.       Objective:   Physical Exam  Nursing note and vitals reviewed. Constitutional: She appears well-developed. No distress.  Psychiatric: She has a normal mood and affect. Her behavior is normal.   Filed Vitals:   06/20/12 1424  BP: 129/89  Pulse: 63  Temp: 97.1 F (36.2 C)  TempSrc: Oral  Height: 6' (1.829 m)  Weight: 325 lb 4.8 oz (147.555 kg)           Assessment & Plan:  PCOS Metformin 500 mg BID 2 hr GTT  Feliza Diven 3:07 PM 06/20/2012

## 2012-06-21 ENCOUNTER — Other Ambulatory Visit: Payer: Self-pay

## 2012-06-21 ENCOUNTER — Encounter: Payer: Self-pay | Admitting: Obstetrics & Gynecology

## 2012-06-21 DIAGNOSIS — Z8742 Personal history of other diseases of the female genital tract: Secondary | ICD-10-CM

## 2012-06-22 LAB — GLUCOSE TOLERANCE, 2 HOURS W/ 1HR: Glucose, 1 hour: 236 mg/dL — ABNORMAL HIGH (ref 70–170)

## 2012-06-24 ENCOUNTER — Telehealth: Payer: Self-pay | Admitting: Obstetrics and Gynecology

## 2012-06-24 NOTE — Telephone Encounter (Signed)
[name], MD More Detail >>      [name], MD      Sent: Thu June 23, 2012  7:36 PM    To: [name]     Message     Abnormal 2hr GTT, recommend PCP referral for diabetes management. Continue metformin   ### Called patient and notified of Dr. Olivia Mackie note above. Gave patient information on [name]. Patient agrees to go and satisfied.

## 2013-06-27 IMAGING — US US PELVIS COMPLETE
1 series · 14 of 25 positions shown · non-contrast
Comparison: 07/17/2008

CLINICAL DATA: Menometrorrhagia.  Heavy vaginal bleeding with
pelvic and back pain.  LMP 10/09/2010

TRANSABDOMINAL AND TRANSVAGINAL ULTRASOUND OF PELVIS
TECHNIQUE: Both transabdominal and transvaginal ultrasound
examinations of the pelvis were performed. Transabdominal technique
was performed for global imaging of the pelvis including uterus,
ovaries, adnexal regions, and pelvic cul-de-sac.

[Series 1: us pelvis complete · 14 of 56 slices shown]
[im 1/56]
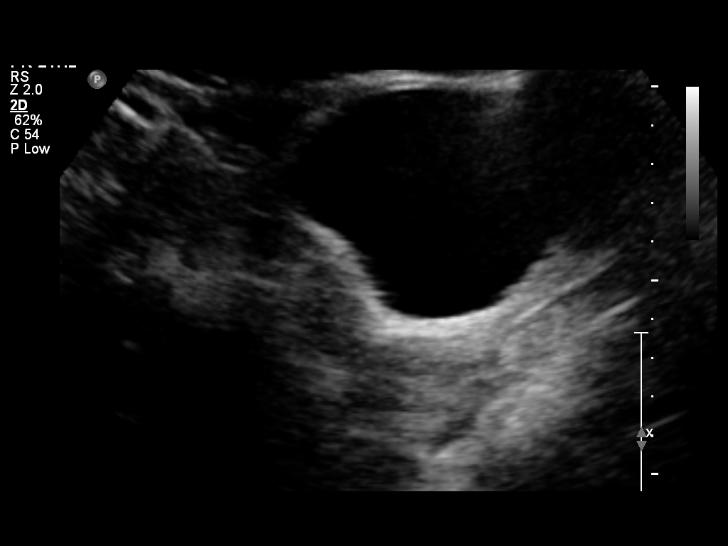
[im 5/56]
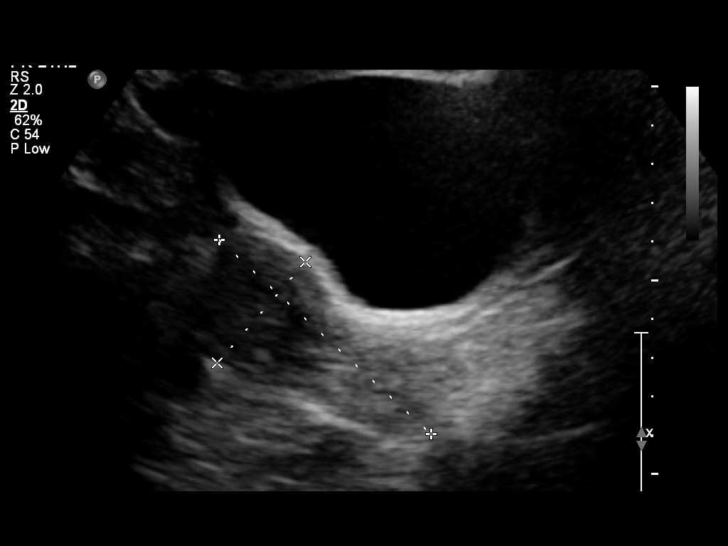
[im 10/56]
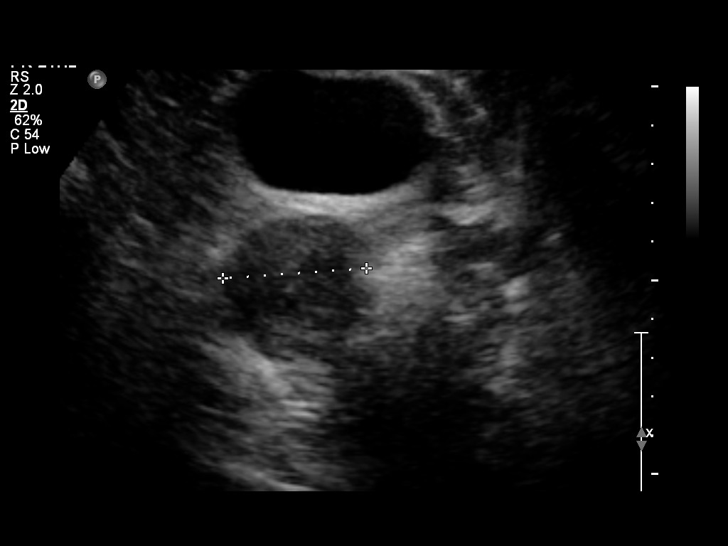
[im 14/56]
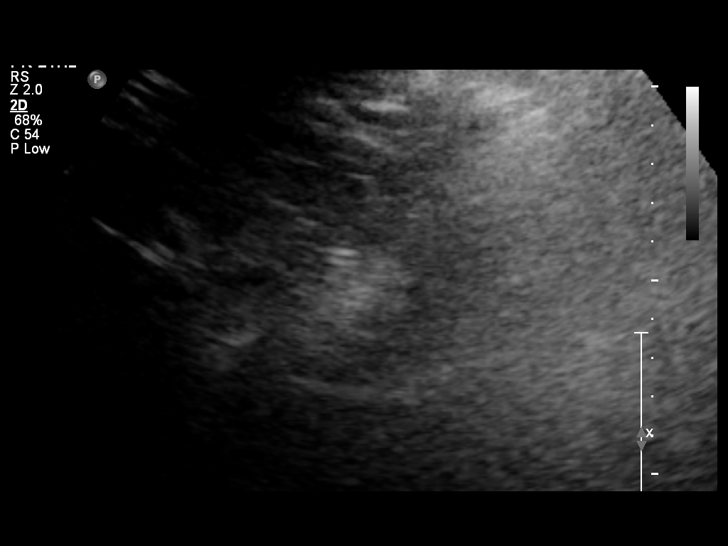
[im 19/56]
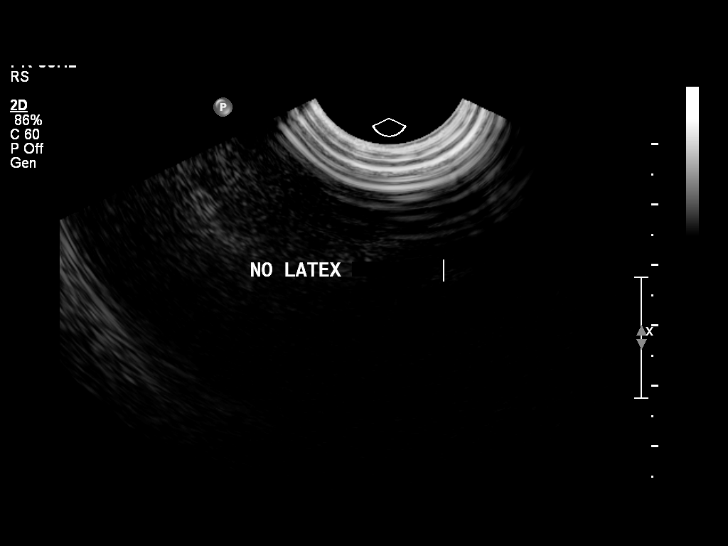
[im 21/56]
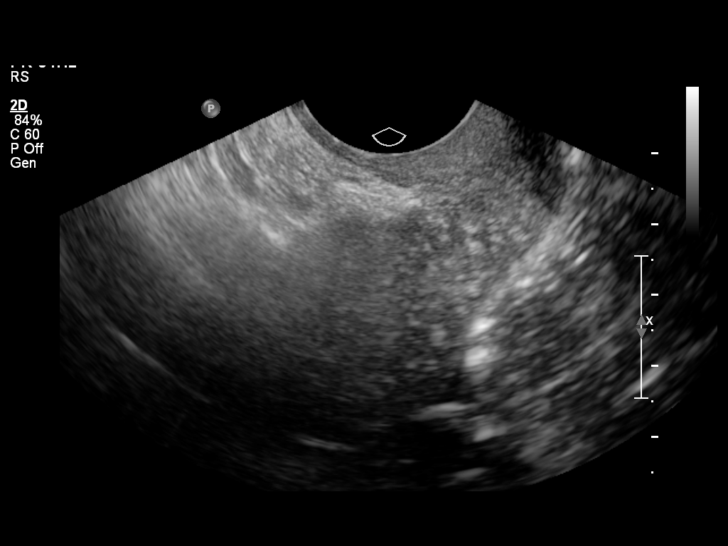
[im 26/56]
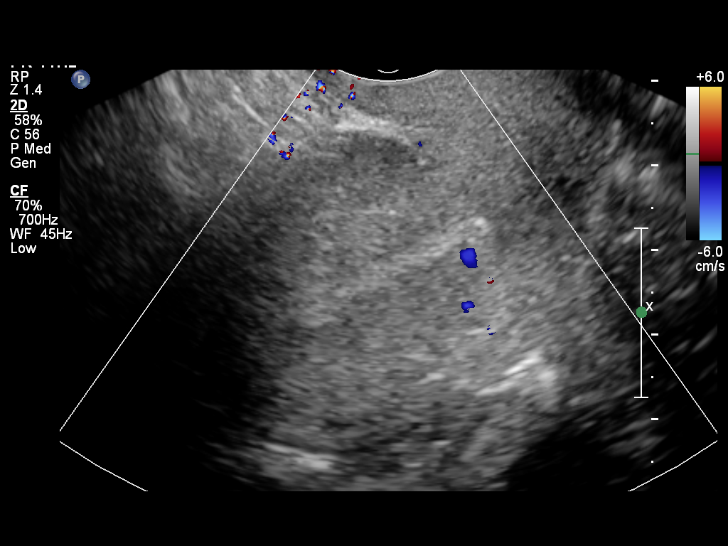
[im 30/56]
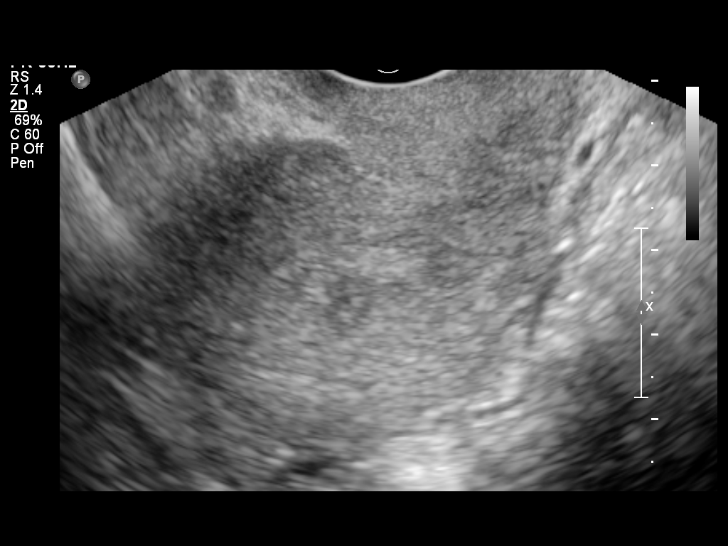
[im 35/56]
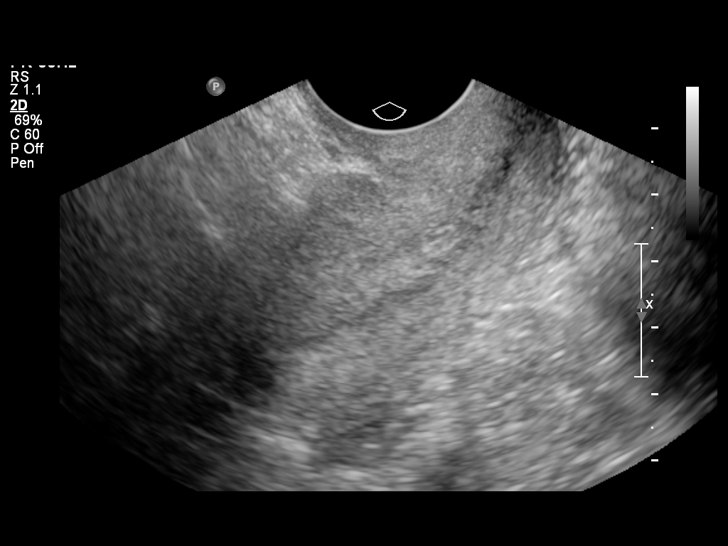
[im 37/56]
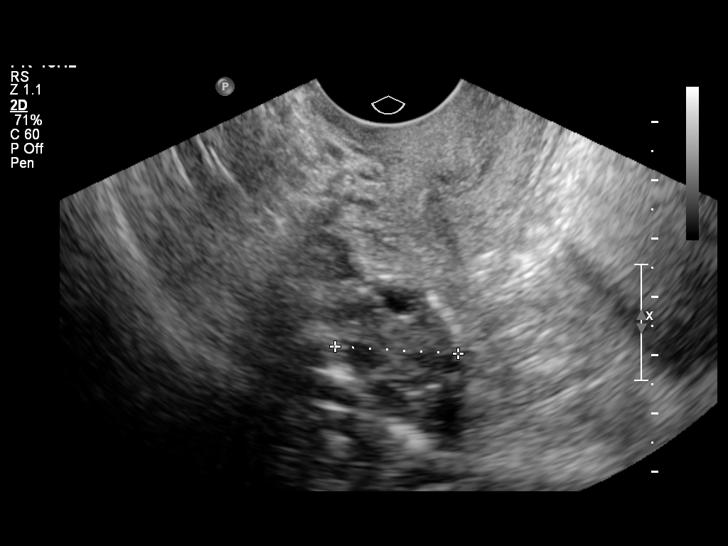
[im 42/56]
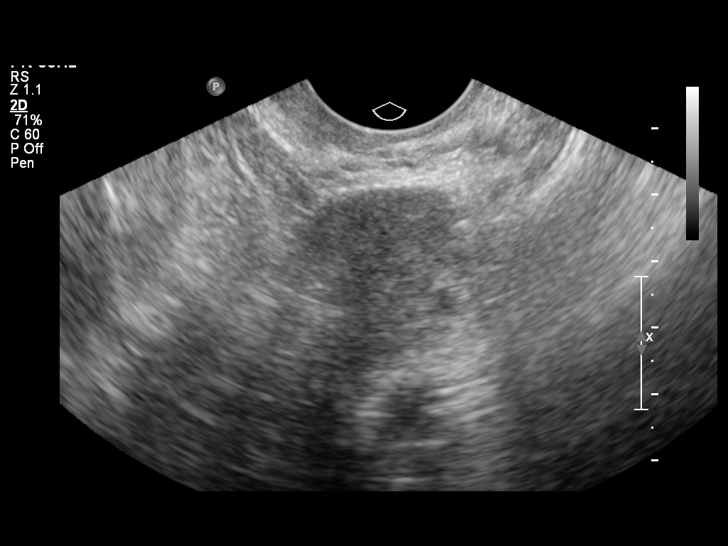
[im 46/56]
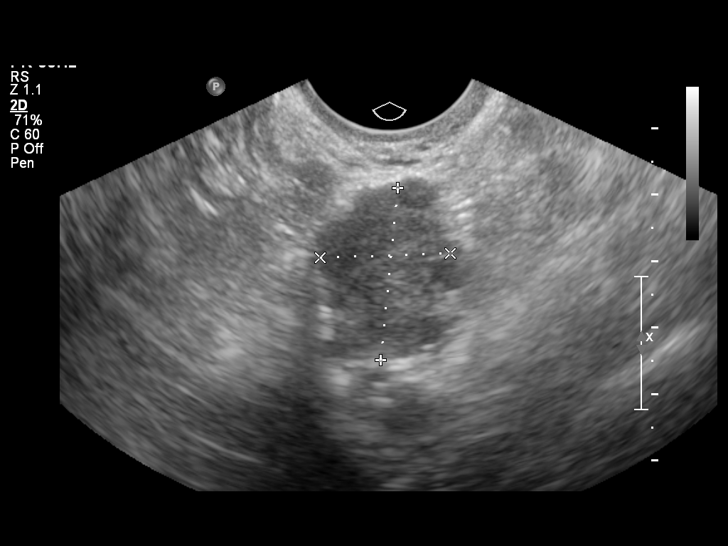
[im 51/56]
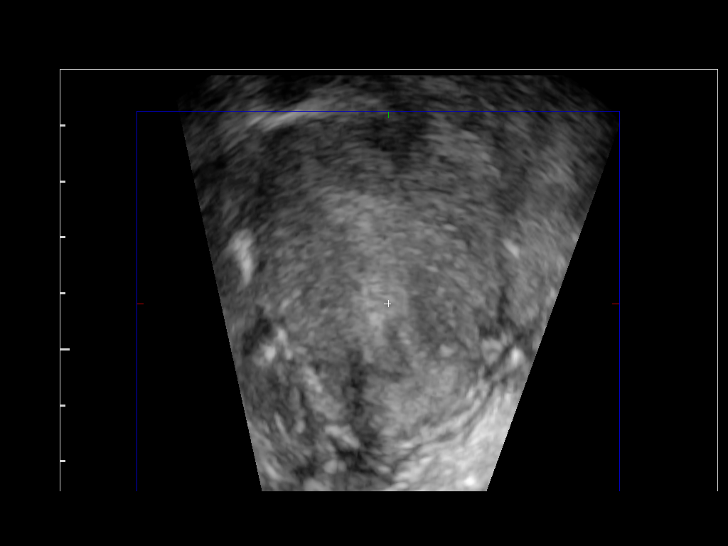
[im 56/56]
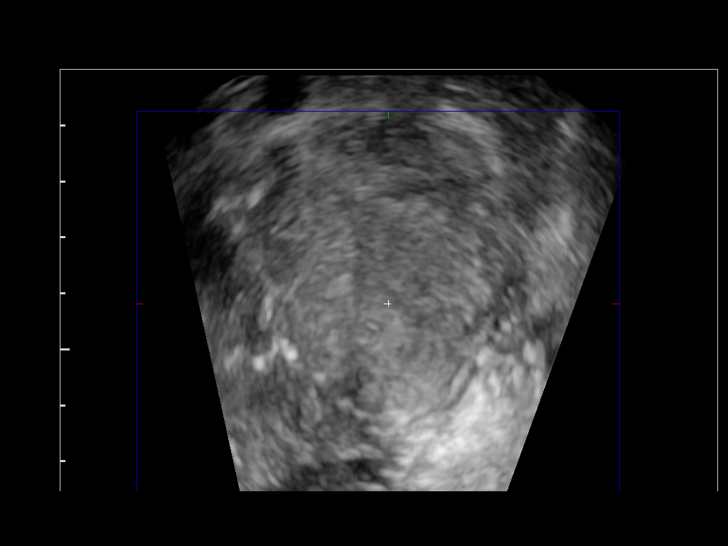

[14 of 25 positions shown; findings below may reference images not displayed]

It was necessary to proceed with endovaginal exam following the
transabdominal exam to visualize the myometrium, endometrium and
adnexa.
FINDINGS: Uterus: Demonstrates a sagittal length of 6.8 cm, an AP depth of
3.6 cm and a transverse width of 4.3 cm.  A homogeneous uterine
myometrium is seen.

Endometrium: Appears homogeneously echogenic with an AP width of 6
mm.  No areas of focal thickening or heterogeneity are identified.

Right ovary:  Has a normal appearance measuring 2.6 x 2.3 x 2.1 cm

Left ovary: Has a normal appearance measuring 2.6 x 2.0 x 2.0 cm

Other findings: No pelvic fluid or separate adnexal masses are
seen.
IMPRESSION: Normal pelvic ultrasound with no focal abnormality identified.

## 2017-07-26 ENCOUNTER — Encounter: Payer: Self-pay | Admitting: *Deleted

## 2017-08-05 ENCOUNTER — Encounter: Payer: Self-pay | Admitting: *Deleted

## 2017-08-19 ENCOUNTER — Encounter: Payer: Self-pay | Admitting: Student

## 2017-08-19 ENCOUNTER — Ambulatory Visit (INDEPENDENT_AMBULATORY_CARE_PROVIDER_SITE_OTHER): Payer: No Typology Code available for payment source | Admitting: Student

## 2017-08-19 VITALS — BP 97/67 | HR 72 | Ht 72.0 in | Wt 325.7 lb

## 2017-08-19 DIAGNOSIS — Z3042 Encounter for surveillance of injectable contraceptive: Secondary | ICD-10-CM | POA: Diagnosis not present

## 2017-08-19 DIAGNOSIS — Z3202 Encounter for pregnancy test, result negative: Secondary | ICD-10-CM | POA: Diagnosis not present

## 2017-08-19 LAB — POCT PREGNANCY, URINE: Preg Test, Ur: NEGATIVE

## 2017-08-19 MED ORDER — MEDROXYPROGESTERONE ACETATE 150 MG/ML IM SUSP
150.0000 mg | Freq: Once | INTRAMUSCULAR | Status: AC
  Administered 2017-08-19: 150 mg via INTRAMUSCULAR

## 2017-08-19 MED ORDER — MEDROXYPROGESTERONE ACETATE 150 MG/ML IM SUSP: 150.0000 mg | INTRAMUSCULAR | 0 refills | Status: DC

## 2017-08-19 MED ORDER — MEDROXYPROGESTERONE ACETATE 150 MG/ML IM SUSP: 150.0000 mg | INTRAMUSCULAR | 4 refills | Status: DC

## 2017-08-19 NOTE — Progress Notes (Signed)
Pt did not need to be seen by provider.Pt refused pap smear today. Annual exam is scheduled for 09/16/17 at 0900. Depo given and tolerated well.UPT negative.  Depo refill was sent pharmacy.

## 2017-08-19 NOTE — Progress Notes (Signed)
Chart reviewed for nurse visit. Agree with plan of care.   Marylene LandKooistra, Ameliyah Sarno Lorraine, CNM 08/19/2017 5:13 PM

## 2017-09-13 ENCOUNTER — Encounter (HOSPITAL_COMMUNITY): Payer: Self-pay | Admitting: Emergency Medicine

## 2017-09-13 ENCOUNTER — Other Ambulatory Visit: Payer: Self-pay

## 2017-09-13 DIAGNOSIS — Z7984 Long term (current) use of oral hypoglycemic drugs: Secondary | ICD-10-CM | POA: Insufficient documentation

## 2017-09-13 DIAGNOSIS — Z79899 Other long term (current) drug therapy: Secondary | ICD-10-CM | POA: Insufficient documentation

## 2017-09-13 DIAGNOSIS — J45909 Unspecified asthma, uncomplicated: Secondary | ICD-10-CM | POA: Diagnosis not present

## 2017-09-13 DIAGNOSIS — E1165 Type 2 diabetes mellitus with hyperglycemia: Secondary | ICD-10-CM | POA: Diagnosis not present

## 2017-09-13 DIAGNOSIS — Z87891 Personal history of nicotine dependence: Secondary | ICD-10-CM | POA: Insufficient documentation

## 2017-09-13 NOTE — ED Triage Notes (Signed)
Pt from home with 272 cbg. Pt states she has type 2 DM. Pt states she is normally around 130. Pt takes 2000 metformin and 4 mg glipizide.  Pt reports nausea and emesis yesterday but none today.

## 2017-09-14 ENCOUNTER — Emergency Department (HOSPITAL_COMMUNITY)
Admission: EM | Admit: 2017-09-14 | Discharge: 2017-09-14 | Disposition: A | Discharge status: Home / Self Care | Payer: No Typology Code available for payment source | Attending: Emergency Medicine | Admitting: Emergency Medicine

## 2017-09-14 DIAGNOSIS — R739 Hyperglycemia, unspecified: Secondary | ICD-10-CM

## 2017-09-14 HISTORY — DX: Type 2 diabetes mellitus without complications: E11.9

## 2017-09-14 HISTORY — DX: Pure hypercholesterolemia, unspecified: E78.00

## 2017-09-14 HISTORY — DX: Migraine, unspecified, not intractable, without status migrainosus: G43.909

## 2017-09-14 LAB — BASIC METABOLIC PANEL
Anion gap: 11 (ref 5–15)
BUN: 15 mg/dL (ref 6–20)
CALCIUM: 9.6 mg/dL (ref 8.9–10.3)
CHLORIDE: 103 mmol/L (ref 101–111)
CO2: 23 mmol/L (ref 22–32)
CREATININE: 1.04 mg/dL — AB (ref 0.44–1.00)
Glucose, Bld: 239 mg/dL — ABNORMAL HIGH (ref 65–99)
Potassium: 4.3 mmol/L (ref 3.5–5.1)
SODIUM: 137 mmol/L (ref 135–145)

## 2017-09-14 LAB — CBC
HCT: 44.8 % (ref 36.0–46.0)
Hemoglobin: 14.4 g/dL (ref 12.0–15.0)
MCH: 28.4 pg (ref 26.0–34.0)
MCHC: 32.1 g/dL (ref 30.0–36.0)
MCV: 88.4 fL (ref 78.0–100.0)
PLATELETS: 208 10*3/uL (ref 150–400)
RBC: 5.07 MIL/uL (ref 3.87–5.11)
RDW: 13.5 % (ref 11.5–15.5)
WBC: 9.7 10*3/uL (ref 4.0–10.5)

## 2017-09-14 LAB — URINALYSIS, ROUTINE W REFLEX MICROSCOPIC
Bilirubin Urine: NEGATIVE
Glucose, UA: NEGATIVE mg/dL
Hgb urine dipstick: NEGATIVE
KETONES UR: 5 mg/dL — AB
LEUKOCYTES UA: NEGATIVE
NITRITE: NEGATIVE
PROTEIN: NEGATIVE mg/dL
Specific Gravity, Urine: 1.027 (ref 1.005–1.030)
pH: 5 (ref 5.0–8.0)

## 2017-09-14 LAB — I-STAT BETA HCG BLOOD, ED (MC, WL, AP ONLY)

## 2017-09-14 LAB — CBG MONITORING, ED
GLUCOSE-CAPILLARY: 242 mg/dL — AB (ref 65–99)
Glucose-Capillary: 216 mg/dL — ABNORMAL HIGH (ref 65–99)

## 2017-09-14 MED ORDER — ATORVASTATIN CALCIUM 10 MG PO TABS: 10.0000 mg | ORAL_TABLET | Freq: Every day | ORAL | 0 refills | Status: DC

## 2017-09-14 NOTE — ED Provider Notes (Signed)
[facility]-EMERGENCY DEPT Provider Note   CSN: [id number] Arrival date & time: 09/13/17  2208     History   Chief Complaint Chief Complaint  Patient presents with  . Hyperglycemia    HPI Miranda Stevens is a 37 y.o. female.  The history is provided by the patient.  Hyperglycemia  Severity:  Moderate Onset quality:  Gradual Timing:  Constant Progression:  Improving Chronicity:  Chronic Diabetes status:  Controlled with oral medications Relieved by:  Nothing Ineffective treatments:  None tried Associated symptoms: increased thirst, polyuria and vomiting   Associated symptoms: no abdominal pain and no fever   Patient with history of diabetes presents for hyperglycemia.  She reports in the previous day her glucose is over 500.  She reports med compliance, she takes metformin and glipizide.  She did have an episode of vomiting previously She has no other new complaints.  Past Medical History:  Diagnosis Date  . Asthma   . Diabetes mellitus without complication (HCC)   . Headache(784.0)   . High cholesterol   . Migraine   . Pericarditis   . Polycystic disease, ovaries     Patient Active Problem List   Diagnosis Date Noted  . General counseling for prescription of oral contraceptives 04/04/2012  . Excessive or frequent menstruation 04/04/2012    Past Surgical History:  Procedure Laterality Date  . BIOPSY ENDOMETRIAL       OB History    Gravida  0   Para      Term      Preterm      AB      Living  0     SAB      TAB      Ectopic      Multiple      Live Births               Home Medications    Prior to Admission medications   Medication Sig Start Date End Date Taking? Authorizing Provider  acetaminophen (TYLENOL) 500 MG tablet Take 1,000 mg by mouth every 6 (six) hours as needed. Pain     Yes [provider]  albuterol (PROVENTIL HFA;VENTOLIN HFA) 108 (90 Base) MCG/ACT inhaler Inhale 1-2 puffs into the  lungs every 6 (six) hours as needed for wheezing or shortness of breath.   Yes [provider]  glimepiride (AMARYL) 4 MG tablet Take 4 mg by mouth daily with breakfast.   Yes [provider]  medroxyPROGESTERone (DEPO-PROVERA) 150 MG/ML injection Inject 1 mL (150 mg total) into the muscle every 3 (three) months. 08/19/17  Yes Marylene Land, CNM  medroxyPROGESTERone (DEPO-PROVERA) 150 MG/ML injection Inject 1 mL (150 mg total) into the muscle every 3 (three) months. 08/19/17  Yes Marylene Land, CNM  metFORMIN (GLUCOPHAGE-XR) 500 MG 24 hr tablet Take 1,000 mg by mouth 2 (two) times daily.   Yes [provider]  rizatriptan (MAXALT) 10 MG tablet Take 10 mg by mouth as needed for migraine. May repeat in 2 hours if needed   Yes [provider]  atorvastatin (LIPITOR) 10 MG tablet Take 1 tablet (10 mg total) by mouth daily. 09/14/17   Zadie Rhine, MD  metFORMIN (GLUCOPHAGE) 500 MG tablet Take 1 tablet (500 mg total) by mouth 2 (two) times daily with a meal. Patient not taking: Reported on 09/14/2017 06/20/12   [name], MD  SUMAtriptan (IMITREX) 50 MG tablet Take 1 tablet (50 mg total) by mouth  every 2 (two) hours as needed for migraine. Patient not taking: Reported on 08/19/2017 04/01/12   Reuben Likes, MD  triamcinolone cream (KENALOG) 0.1 % Apply topically 2 (two) times daily. Apply to affected area Patient not taking: Reported on 08/19/2017 03/15/12   Janne Napoleon, NP    Family History Family History  Problem Relation Age of Onset  . Lung disease Mother   . Diabetes Mother   . Heart disease Mother   . Hyperlipidemia Father     Social History Social History   Tobacco Use  . Smoking status: Former Smoker    Years: 10.00    Types: Cigarettes    Last attempt to quit: 01/31/2011    Years since quitting: 6.6  . Smokeless tobacco: Never Used  Substance Use Topics  . Alcohol use: Yes    Comment: social  . Drug use: Yes     Types: Marijuana    Comment: laswt used 1 week ago     Allergies   Aspirin   Review of Systems Review of Systems  Constitutional: Negative for fever.  Gastrointestinal: Positive for vomiting. Negative for abdominal pain.  Endocrine: Positive for polydipsia and polyuria.  All other systems reviewed and are negative.    Physical Exam Updated Vital Signs BP (!) 154/72 (BP Location: Left Arm)   Pulse 88   Temp 97.8 F (36.6 C) (Oral)   Resp 14   Ht 1.803 m (5\' 11" )   Wt (!) 145.6 kg (321 lb)   SpO2 94%   BMI 44.77 kg/m   Physical Exam  CONSTITUTIONAL: Well developed/well nourished HEAD: Normocephalic/atraumatic EYES: EOMI/PERRL ENMT: Mucous membranes moist NECK: supple no meningeal signs SPINE/BACK:entire spine nontender CV: S1/S2 noted, no murmurs/rubs/gallops noted LUNGS: Lungs are clear to auscultation bilaterally, no apparent distress ABDOMEN: soft, obese NEURO: Pt is awake/alert/appropriate, moves all extremitiesx4.  No facial droop.   EXTREMITIES: pulses normal/equal, full ROM SKIN: warm, color normal PSYCH: no abnormalities of mood noted, alert and oriented to situation  ED Treatments / Results  Labs (all labs ordered are listed, but only abnormal results are displayed) Labs Reviewed  BASIC METABOLIC PANEL - Abnormal; Notable for the following components:      Result Value   Glucose, Bld 239 (*)    Creatinine, Ser 1.04 (*)    All other components within normal limits  URINALYSIS, ROUTINE W REFLEX MICROSCOPIC - Abnormal; Notable for the following components:   Ketones, ur 5 (*)    All other components within normal limits  CBG MONITORING, ED - Abnormal; Notable for the following components:   Glucose-Capillary 242 (*)    All other components within normal limits  CBG MONITORING, ED - Abnormal; Notable for the following components:   Glucose-Capillary 216 (*)    All other components within normal limits  CBC  I-STAT BETA HCG BLOOD, ED ([facility], [facility], [facility]  ONLY)    EKG None  Radiology No results found.  Procedures Procedures (including critical care time)  Medications Ordered in ED Medications - No data to display   Initial Impression / Assessment and Plan / ED Course  I have reviewed the triage vital signs and the nursing notes.  Pertinent labs  results that were available during my care of the patient were reviewed by me and considered in my medical decision making (see chart for details).     Patient well-appearing, no distress.  No signs of DKA.  Glucose is improved.  She just moved back here from [place].  Will refer to PCPs.  She has plenty of metformin and glipizide, but she does request Lipitor.  Also advised close follow-up for blood pressure recheck.   Final Clinical Impressions(s) / ED Diagnoses   Final diagnoses:  Hyperglycemia    ED Discharge Orders        Ordered    atorvastatin (LIPITOR) 10 MG tablet  Daily     09/14/17 0618       Zadie RhineWickline, Pablo Mathurin, MD 09/14/17 [phone]

## 2017-09-16 ENCOUNTER — Ambulatory Visit: Payer: No Typology Code available for payment source | Admitting: Student

## 2017-09-16 ENCOUNTER — Encounter: Payer: Self-pay | Admitting: *Deleted

## 2017-09-16 NOTE — Progress Notes (Signed)
Miranda Stevens did not keep her scheduled appointment for pap . Per discussion with Luna KitchensKathryn Kooistra, [facility] does not need to be called, may reschedule if she calls.

## 2017-11-01 ENCOUNTER — Ambulatory Visit (INDEPENDENT_AMBULATORY_CARE_PROVIDER_SITE_OTHER): Payer: No Typology Code available for payment source | Admitting: Family Medicine

## 2017-11-01 ENCOUNTER — Encounter: Payer: Self-pay | Admitting: Family Medicine

## 2017-11-01 ENCOUNTER — Telehealth: Payer: Self-pay | Admitting: Family Medicine

## 2017-11-01 VITALS — BP 106/80 | HR 72 | Temp 98.0°F | Ht 72.0 in | Wt 312.0 lb

## 2017-11-01 DIAGNOSIS — E1165 Type 2 diabetes mellitus with hyperglycemia: Secondary | ICD-10-CM

## 2017-11-01 DIAGNOSIS — J302 Other seasonal allergic rhinitis: Secondary | ICD-10-CM

## 2017-11-01 DIAGNOSIS — J4520 Mild intermittent asthma, uncomplicated: Secondary | ICD-10-CM | POA: Diagnosis not present

## 2017-11-01 DIAGNOSIS — Z8669 Personal history of other diseases of the nervous system and sense organs: Secondary | ICD-10-CM | POA: Diagnosis not present

## 2017-11-01 DIAGNOSIS — Z7689 Persons encountering health services in other specified circumstances: Secondary | ICD-10-CM | POA: Diagnosis not present

## 2017-11-01 DIAGNOSIS — IMO0001 Reserved for inherently not codable concepts without codable children: Secondary | ICD-10-CM

## 2017-11-01 DIAGNOSIS — F17210 Nicotine dependence, cigarettes, uncomplicated: Secondary | ICD-10-CM | POA: Insufficient documentation

## 2017-11-01 DIAGNOSIS — E282 Polycystic ovarian syndrome: Secondary | ICD-10-CM | POA: Diagnosis not present

## 2017-11-01 DIAGNOSIS — R0683 Snoring: Secondary | ICD-10-CM

## 2017-11-01 LAB — GLUCOSE, POCT (MANUAL RESULT ENTRY): POC Glucose: 391 mg/dl — AB (ref 70–99)

## 2017-11-01 LAB — POCT GLYCOSYLATED HEMOGLOBIN (HGB A1C): Hemoglobin A1C: 12.6 % — AB (ref 4.0–5.6)

## 2017-11-01 MED ORDER — INSULIN LISPRO 100 UNIT/ML (KWIKPEN): 10.0000 [IU] | PEN_INJECTOR | Freq: Three times a day (TID) | SUBCUTANEOUS | 11 refills | Status: DC

## 2017-11-01 MED ORDER — INSULIN GLARGINE 100 UNIT/ML SOLOSTAR PEN: 30.0000 [IU] | PEN_INJECTOR | Freq: Every day | SUBCUTANEOUS | 11 refills | Status: DC

## 2017-11-01 MED ORDER — PEN NEEDLES 32G X 6 MM MISC: 1.0000 | 11 refills | Status: DC

## 2017-11-01 NOTE — Progress Notes (Signed)
Patient presents to clinic today to establish care.  SUBJECTIVE: PMH: Pt is a 37 yo female with pmh sig for asthma, seasonal allergies, DM II, migraines, PCOS, HLD.  Pt was previously seen in [place].  Diabetes, type II: -Pt taking metformin 1000 mg twice daily and glimepiride 4 mg. -Pt states she has not been checking her FS BS in the last few weeks as she was trying to give her "fingers a chance to rest" -Patient states last hemoglobin A1c was 12%. -P is not exercisingt   Asthma: -Pt states she notices symptoms more in the winter. -Pt has albuterol inhaler as needed  Seasonal allergies: -Pt using Flonase as needed  History of migraines: -Pt taking generic Maxalt -Last headache was in January -Pt now having headache maybe once per month  H/o snoring: -Pt states in the past she went to have a sleep study but was unable to complete it. -Pt felt anxious/like she can breathe with the CPAP on. -Pt wishes to try another sleep study. -Pt endorses waking up still feeling tired  Nicotine use: -Patient smoking 1 cigarette/day -Patient considering quitting but states she needs to ease out of it.  Seasonal allergies: -Taking Flonase as needed  PCOS: -taking metformin -on Depo -had spotting 10/18 -followed by OB/Gyn  Allergies: -Patient was told not to take aspirin given concern for increased bleeding while on Depo.  Past surgical history: None  Social history: Pt is single.  She is currently between jobs.  Pt recently graduated with an associates.  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  Pt endorses social alcohol use.  Pt denies drug use  Family medical history: mom-deceased, arthritis, diabetes Dad-alive, arthritis, HLD, HTN, kidney disease Sister-Sia, alive, mental illness Brother-Dashawn, alive   Health Maintenance: Dental --[facility]dock and [place] --my eye doctor, [place] PAP -- 06/2016   Past Medical History:  Diagnosis Date  . Asthma     . Diabetes mellitus without complication ([place])   . Headache(784.0)   . High cholesterol   . Migraine   . Migraine   . Pericarditis   . Polycystic disease, ovaries     Past Surgical History:  Procedure Laterality Date  . BIOPSY ENDOMETRIAL      Current Outpatient Medications on File Prior to Visit  Medication Sig Dispense Refill  . acetaminophen (TYLENOL) 500 MG tablet Take 1,000 mg by mouth every 6 (six) hours as needed. Pain      . albuterol (PROVENTIL HFA;VENTOLIN HFA) 108 (90 Base) MCG/ACT inhaler Inhale 1-2 puffs into the lungs every 6 (six) hours as needed for wheezing or shortness of breath.    Marland Kitchen atorvastatin (LIPITOR) 10 MG tablet Take 1 tablet (10 mg total) by mouth daily. 30 tablet 0  . glimepiride (AMARYL) 4 MG tablet Take 4 mg by mouth daily with breakfast.    . medroxyPROGESTERone (DEPO-PROVERA) 150 MG/ML injection Inject 1 mL (150 mg total) into the muscle every 3 (three) months. 1 mL 4  . metFORMIN (GLUCOPHAGE) 500 MG tablet Take 1 tablet (500 mg total) by mouth 2 (two) times daily with a meal. 60 tablet 6  . metFORMIN (GLUCOPHAGE-XR) 500 MG 24 hr tablet Take 1,000 mg by mouth 2 (two) times daily.    . rizatriptan (MAXALT) 10 MG tablet Take 10 mg by mouth as needed for migraine. May repeat in 2 hours if needed    . triamcinolone cream (KENALOG) 0.1 % Apply topically 2 (two) times daily. Apply to  affected area 30 g 0   No current facility-administered medications on file prior to visit.     Allergies  Allergen Reactions  . Aspirin Other (See Comments)    NO aspirin    Family History  Problem Relation Age of Onset  . Lung disease Mother   . Diabetes Mother   . Heart disease Mother   . Hyperlipidemia Father     Social History   Socioeconomic History  . Marital status: Single    Spouse name: Not on file  . Number of children: Not on file  . Years of education: Not on file  . Highest education level: Not on file  Occupational History  . Not on file   Social Needs  . Financial resource strain: Not on file  . Food insecurity:    Worry: Not on file    Inability: Not on file  . Transportation needs:    Medical: Not on file    Non-medical: Not on file  Tobacco Use  . Smoking status: Former Smoker    Years: 10.00    Types: Cigarettes    Last attempt to quit: 01/31/2011    Years since quitting: 6.7  . Smokeless tobacco: Never Used  Substance and Sexual Activity  . Alcohol use: Yes    Comment: social  . Drug use: Yes    Types: Marijuana    Comment: laswt used 1 week ago  . Sexual activity: Not Currently  Lifestyle  . Physical activity:    Days per week: Not on file    Minutes per session: Not on file  . Stress: Not on file  Relationships  . Social connections:    Talks on phone: Not on file    Gets together: Not on file    Attends religious service: Not on file    Active member of club or organization: Not on file    Attends meetings of clubs or organizations: Not on file    Relationship status: Not on file  . Intimate partner violence:    Fear of current or ex partner: Not on file    Emotionally abused: Not on file    Physically abused: Not on file    Forced sexual activity: Not on file  Other Topics Concern  . Not on file  Social History Narrative  . Not on file    ROS General: Denies fever, chills, night sweats, changes in weight, changes in appetite HEENT: Denies headaches, ear pain, changes in vision, rhinorrhea, sore throat CV: Denies CP, palpitations, SOB, orthopnea Pulm: Denies SOB, cough, wheezing GI: Denies abdominal pain, nausea, vomiting, diarrhea, constipation GU: Denies dysuria, hematuria, vaginal discharge  +frequency Msk: Denies muscle cramps, joint pains Neuro: Denies weakness, numbness, tingling Skin: Denies rashes, bruising Psych: Denies depression, anxiety, hallucinations  BP 106/80 (BP Location: Left Arm, Patient Position: Sitting, Cuff Size: Large)   Pulse 72   Temp 98 F (36.7 C) (Oral)    Ht 6' (1.829 m)   Wt (!) 312 lb (141.5 kg)   LMP 03/03/2017 (Exact Date)   SpO2 98%   BMI 42.31 kg/m   Physical Exam Gen. Pleasant, well developed, obese, well-nourished, in NAD HEENT - [place]/AT, PERRL, no scleral icterus, no nasal drainage, pharynx without erythema or exudate.  TMs normal bilaterally.  No cervical lymphadenopathy. Lungs: no use of accessory muscles, CTAB, no wheezes, rales or rhonchi Cardiovascular: RRR, No r/g/m, no peripheral edema Abdomen: BS present, soft, nontender, nondistended Neuro:  A&Ox3, CN II-XII intact, normal  gait Skin:  Warm, dry, intact, no lesions Psych: normal affect, mood appropriate  Recent Results (from the past 2160 hour(s))  Pregnancy, urine POC     Status: None   Collection Time: 08/19/17  4:04 PM  Result Value Ref Range   Preg Test, Ur NEGATIVE NEGATIVE    Comment:        THE SENSITIVITY OF THIS METHODOLOGY IS >24 mIU/mL   CBG monitoring, ED     Status: Abnormal   Collection Time: 09/14/17 12:10 AM  Result Value Ref Range   Glucose-Capillary 242 (H) 65 - 99 mg/dL   Comment 1 Notify RN   Basic metabolic panel     Status: Abnormal   Collection Time: 09/14/17 12:23 AM  Result Value Ref Range   Sodium 137 135 - 145 mmol/L   Potassium 4.3 3.5 - 5.1 mmol/L   Chloride 103 101 - 111 mmol/L   CO2 23 22 - 32 mmol/L   Glucose, Bld 239 (H) 65 - 99 mg/dL   BUN 15 6 - 20 mg/dL   Creatinine, Ser 1.04 (H) 0.44 - 1.00 mg/dL   Calcium 9.6 8.9 - 10.3 mg/dL   GFR calc non Af Amer >60 >60 mL/min   GFR calc Af Amer >60 >60 mL/min    Comment: (NOTE) The eGFR has been calculated using the CKD EPI equation. This calculation has not been validated in all clinical situations. eGFR's persistently <60 mL/min signify possible Chronic Kidney Disease.    Anion gap 11 5 - 15    Comment: Performed at [facility], [place] [address], [place], [place] [address]  CBC     Status: None   Collection Time: 09/14/17 12:23 AM  Result Value Ref  Range   WBC 9.7 4.0 - 10.5 K/uL   RBC 5.07 3.87 - 5.11 MIL/uL   Hemoglobin 14.4 12.0 - 15.0 g/dL   HCT 44.8 36.0 - 46.0 %   MCV 88.4 78.0 - 100.0 fL   MCH 28.4 26.0 - 34.0 pg   MCHC 32.1 30.0 - 36.0 g/dL   RDW 13.5 11.5 - 15.5 %   Platelets 208 150 - 400 K/uL    Comment: Performed at [facility], [place] [address] [place], [place] [address]  Urinalysis, Routine w reflex microscopic     Status: Abnormal   Collection Time: 09/14/17 12:24 AM  Result Value Ref Range   Color, Urine YELLOW YELLOW   APPearance CLEAR CLEAR   Specific Gravity, Urine 1.027 1.005 - 1.030   pH 5.0 5.0 - 8.0   Glucose, UA NEGATIVE NEGATIVE mg/dL   Hgb urine dipstick NEGATIVE NEGATIVE   Bilirubin Urine NEGATIVE NEGATIVE   Ketones, ur 5 (A) NEGATIVE mg/dL   Protein, ur NEGATIVE NEGATIVE mg/dL   Nitrite NEGATIVE NEGATIVE   Leukocytes, UA NEGATIVE NEGATIVE    Comment: Performed at [facility] [address] [place], [place] [address]  I-Stat beta hCG blood, ED     Status: None   Collection Time: 09/14/17 12:34 AM  Result Value Ref Range   I-stat hCG, quantitative <5.0 <5 mIU/mL   Comment 3            Comment:   GEST. AGE      CONC.  (mIU/mL)   <=1 WEEK        5 - 50     2 WEEKS       50 - 500     3 WEEKS  100 - 10,000     4 WEEKS     1,000 - 30,000        FEMALE AND NON-PREGNANT FEMALE:     LESS THAN 5 mIU/mL   CBG monitoring, ED     Status: Abnormal   Collection Time: 09/14/17  4:50 AM  Result Value Ref Range   Glucose-Capillary 216 (H) 65 - 99 mg/dL    Assessment/Plan: Uncontrolled diabetes mellitus type 2 without complications (HCC)  -Hgb A1C 12.6% and fsbs 391 today. -will start lantus 30 units and humalog 10 units TID with meals -pt to continue metformin 1000 mg BID and glimepiride 4 mg daily - Plan: POCT glycosylated hemoglobin (Hb A1C), POC Glucose (CBG), Insulin Glargine (LANTUS) 100 UNIT/ML Solostar Pen, Ambulatory referral to diabetic education,  insulin lispro (HUMALOG) 100 UNIT/ML KiwkPen, Ambulatory referral to Endocrinology  Mild intermittent asthma without complication -Continue albuterol inhaler as needed   History of migraine -Continue generic Maxalt as needed -Encouraged to get plenty of rest each night, increase p.o. intake of water, limit caffeine intake, eat a balanced diet  Snoring  - Plan: Ambulatory referral to Pulmonology  Encounter to establish care -We reviewed the PMH, PSH, FH, SH, Meds and Allergies. -We provided refills for any medications we will prescribe as needed. -We addressed current concerns per orders and patient instructions. -We have asked for records for pertinent exams, studies, vaccines and notes from previous providers. -We have advised patient to follow up per instructions below.  Seasonal allergies: -Continue Flonase as needed  Cigarette nicotine dependence without complication -Smoking cessation counseling greater than 3 minutes, less than 10 minutes -Discussed cutting down patient is not smoking that much -Patient seems hesitant -We will readdress at each visit  PCOS -Continue metformin -Continue Depo -Patient encouraged to find an area OB/GYN to follow-up with  Follow-up in 2 wks, sooner if needed  Grier Mitts, MD

## 2017-11-01 NOTE — Telephone Encounter (Signed)
Copied from CRM [phone]#[id number]. Topic: Inquiry >> Nov 01, 2017  3:07 PM Yvonna Alanisobinson, Andra M wrote: Reason for CRM: Patient states that paperwork will be sent to the office in order for the doctor to sign. Patient states that this paperwork with allow her to received financial help in obtaining Insulin Glargine (LANTUS) 100 UNIT/ML Solostar Pen. Patient also states that she needs Dr. Salomon FickBanks to send her prescription of insulin lispro (HUMALOG) 100 UNIT/ML KiwkPen to [id number] [place] - [address] [facility] [address] [facility] [phone] (Phone)   [phone] (Fax).  Please call [phone].       Thank You!!!

## 2017-11-01 NOTE — Patient Instructions (Addendum)
Coping With Diabetes Diabetes (type 1 diabetes mellitus or type 2 diabetes mellitus) is a condition in which the body does not have enough of a hormone called insulin, or the body does not respond properly to insulin. Normally, insulin allows sugars (glucose) to enter cells in the body. The cells use glucose for energy. With diabetes, extra glucose builds up in the blood instead of going into cells, which results in high blood glucose (hyperglycemia). How to manage lifestyle changes Managing diabetes includes medical treatments as well as lifestyle changes. If diabetes is not managed well, serious physical and emotional complications can occur. Taking good care of yourself means that you are responsible for:  Monitoring glucose regularly.  Eating a healthy diet.  Exercising regularly.  Meeting with health care providers.  Taking medicines as directed.  Some people may feel a lot of stress about managing their diabetes. This is known as emotional distress, and it is very common. Living with diabetes can place you at risk for emotional distress, depression, or anxiety. These disorders can be confusing and can make diabetes management more difficult. How to recognize stress Emotional distress Symptoms of emotional distress include:  Anger about having a diagnosis of diabetes.  Fear or frustration about your diagnosis and the changes you need to make to manage the condition.  Being overly worried about the care that you need or the cost of the care you need.  Feeling like you caused your condition by doing something wrong.  Fear of unpredictable situations, like low or high blood glucose.  Feeling judged by your health care providers.  Feeling very alone with the disease.  Getting too tired or "burned out" with the demands of daily care.  Depression Having diabetes means that you are at a higher risk for depression. Having depression also means that you are at a higher risk for  diabetes. Your health care provider may test (screen) you for symptoms of depression. It is important to recognize depression symptoms and to start treatment for it soon after it is diagnosed. The following are some symptoms of depression:  Loss of interest in things that you used to enjoy.  Trouble sleeping, or often waking up early and not being able to get back to sleep.  A change in appetite.  Feeling tired most of the day.  Feeling nervous and anxious.  Feeling guilty and worrying that you are a burden to others.  Feeling depressed more often than you do not feel that way.  Thoughts of hurting yourself or feeling that you want to die.  If you have any of these symptoms for 2 weeks or longer, reach out to a health care provider. Where to find support  Ask your health care provider to recommend a therapist who understands both depression and diabetes.  Search for information and support from the American Diabetes Association: www.diabetes.org  Find a certified diabetes educator and make an appointment through [place] [facility] Diabetes Educators: www.diabeteseducator.org Follow these instructions at home: Managing emotional distress The following are some ways to manage emotional distress:  Talk with your health care provider or certified diabetes educator. Consider working with a counselor or therapist.  Learn as much as you can about diabetes and its treatment. Meet with a certified diabetes educator or take a class to learn how to manage your condition.  Keep a journal of your thoughts and concerns.  Accept that some things are out of your control.  Talk with other people who have diabetes. It  can help to talk with others about the emotional distress that you feel.  Find ways to manage stress that work for you. These may include art or music therapy, exercise, meditation, and hobbies.  Seek support from spiritual leaders, family, and friends.  General  instructions  Follow your diabetes management plan.  Keep all follow-up visits as told by your health care provider. This is important. Get help right away if:  You have thoughts about hurting yourself or others. If you ever feel like you may hurt yourself or others, or have thoughts about taking your own life, get help right away. You can go to your nearest emergency department or call:  Your local emergency services (911 in the [place]  A suicide crisis helpline, such as the National Suicide Prevention Lifeline at 1-800-273-8255. This is open 24 hours a day.  Summary  Diabetes (type 1 diabetes mellitus or type 2 diabetes mellitus) is a condition in which the body does not have enough of a hormone called insulin, or the body does not respond properly to insulin.  Living with diabetes puts you at risk for medical issues, and it also puts you at risk for emotional issues such as emotional distress, depression, and anxiety.  Recognizing the symptoms of emotional distress and depression may help you avoid problems with your diabetes control. It is important to start treatment for emotional distress and depression soon after they are diagnosed.  Having diabetes means that you are at a higher risk for depression. Ask your health care provider to recommend a therapist who understands both depression and diabetes.  If you experience symptoms of emotional distress or depression, it is important to discuss this with your health care provider, certified diabetes educator, or therapist. This information is not intended to replace advice given to you by your health care provider. Make sure you discuss any questions you have with your health care provider. Document Released: 10/01/2016 Document Revised: 10/01/2016 Document Reviewed: 10/01/2016 Elsevier Interactive Patient Education  2018 [employer]  Diabetes Mellitus and Nutrition When you have diabetes (diabetes mellitus), it is very important  to have healthy eating habits because your blood sugar (glucose) levels are greatly affected by what you eat and drink. Eating healthy foods in the appropriate amounts, at about the same times every day, can help you:  Control your blood glucose.  Lower your risk of heart disease.  Improve your blood pressure.  Reach or maintain a healthy weight.  Every person with diabetes is different, and each person has different needs for a meal plan. Your health care provider may recommend that you work with a diet and nutrition specialist (dietitian) to make a meal plan that is best for you. Your meal plan may vary depending on factors such as:  The calories you need.  The medicines you take.  Your weight.  Your blood glucose, blood pressure, and cholesterol levels.  Your activity level.  Other health conditions you have, such as heart or kidney disease.  How do carbohydrates affect me? Carbohydrates affect your blood glucose level more than any other type of food. Eating carbohydrates naturally increases the amount of glucose in your blood. Carbohydrate counting is a method for keeping track of how many carbohydrates you eat. Counting carbohydrates is important to keep your blood glucose at a healthy level, especially if you use insulin or take certain oral diabetes medicines. It is important to know how many carbohydrates you can safely have in each meal. This is different for   every person. Your dietitian can help you calculate how many carbohydrates you should have at each meal and for snack. Foods that contain carbohydrates include:  Bread, cereal, rice, pasta, and crackers.  Potatoes and corn.  Peas, beans, and lentils.  Milk and yogurt.  Fruit and juice.  Desserts, such as cakes, cookies, ice cream, and candy.  How does alcohol affect me? Alcohol can cause a sudden decrease in blood glucose (hypoglycemia), especially if you use insulin or take certain oral diabetes medicines.  Hypoglycemia can be a life-threatening condition. Symptoms of hypoglycemia (sleepiness, dizziness, and confusion) are similar to symptoms of having too much alcohol. If your health care provider says that alcohol is safe for you, follow these guidelines:  Limit alcohol intake to no more than 1 drink per day for nonpregnant women and 2 drinks per day for men. One drink equals 12 oz of beer, 5 oz of wine, or 1 oz of hard liquor.  Do not drink on an empty stomach.  Keep yourself hydrated with water, diet soda, or unsweetened iced tea.  Keep in mind that regular soda, juice, and other mixers may contain a lot of sugar and must be counted as carbohydrates.  What are tips for following this plan? Reading food labels  Start by checking the serving size on the label. The amount of calories, carbohydrates, fats, and other nutrients listed on the label are based on one serving of the food. Many foods contain more than one serving per package.  Check the total grams (g) of carbohydrates in one serving. You can calculate the number of servings of carbohydrates in one serving by dividing the total carbohydrates by 15. For example, if a food has 30 g of total carbohydrates, it would be equal to 2 servings of carbohydrates.  Check the number of grams (g) of saturated and trans fats in one serving. Choose foods that have low or no amount of these fats.  Check the number of milligrams (mg) of sodium in one serving. Most people should limit total sodium intake to less than 2,300 mg per day.  Always check the nutrition information of foods labeled as "low-fat" or "nonfat". These foods may be higher in added sugar or refined carbohydrates and should be avoided.  Talk to your dietitian to identify your daily goals for nutrients listed on the label. Shopping  Avoid buying canned, premade, or processed foods. These foods tend to be high in fat, sodium, and added sugar.  Shop around the outside edge of the  grocery store. This includes fresh fruits and vegetables, bulk grains, fresh meats, and fresh dairy. Cooking  Use low-heat cooking methods, such as baking, instead of high-heat cooking methods like deep frying.  Cook using healthy oils, such as olive, canola, or sunflower oil.  Avoid cooking with butter, cream, or high-fat meats. Meal planning  Eat meals and snacks regularly, preferably at the same times every day. Avoid going long periods of time without eating.  Eat foods high in fiber, such as fresh fruits, vegetables, beans, and whole grains. Talk to your dietitian about how many servings of carbohydrates you can eat at each meal.  Eat 4-6 ounces of lean protein each day, such as lean meat, chicken, fish, eggs, or tofu. 1 ounce is equal to 1 ounce of meat, chicken, or fish, 1 egg, or 1/4 cup of tofu.  Eat some foods each day that contain healthy fats, such as avocado, nuts, seeds, and fish. Lifestyle   Check your blood  glucose regularly.  Exercise at least 30 minutes 5 or more days each week, or as told by your health care provider.  Take medicines as told by your health care provider.  Do not use any products that contain nicotine or tobacco, such as cigarettes and e-cigarettes. If you need help quitting, ask your health care provider.  Work with a Veterinary surgeon or diabetes educator to identify strategies to manage stress and any emotional and social challenges. What are some questions to ask my health care provider?  Do I need to meet with a diabetes educator?  Do I need to meet with a dietitian?  What number can I call if I have questions?  When are the best times to check my blood glucose? Where to find more information:  American Diabetes Association: diabetes.org/food-and-fitness/food  Academy of Nutrition and Dietetics: https://www.vargas.com/  [employer] of Diabetes and Digestive and Kidney Diseases (NIH):  FindJewelers.cz Summary  A healthy meal plan will help you control your blood glucose and maintain a healthy lifestyle.  Working with a diet and nutrition specialist (dietitian) can help you make a meal plan that is best for you.  Keep in mind that carbohydrates and alcohol have immediate effects on your blood glucose levels. It is important to count carbohydrates and to use alcohol carefully. This information is not intended to replace advice given to you by your health care provider. Make sure you discuss any questions you have with your health care provider. Document Released: 02/12/2005 Document Revised: 06/22/2016 Document Reviewed: 06/22/2016 Elsevier Interactive Patient Education  2018 [employer].  Diabetes Mellitus and Exercise Exercising regularly is important for your overall health, especially when you have diabetes (diabetes mellitus). Exercising is not only about losing weight. It has many health benefits, such as increasing muscle strength and bone density and reducing body fat and stress. This leads to improved fitness, flexibility, and endurance, all of which result in better overall health. Exercise has additional benefits for people with diabetes, including:  Reducing appetite.  Helping to lower and control blood glucose.  Lowering blood pressure.  Helping to control amounts of fatty substances (lipids) in the blood, such as cholesterol and triglycerides.  Helping the body to respond better to insulin (improving insulin sensitivity).  Reducing how much insulin the body needs.  Decreasing the risk for heart disease by: ? Lowering cholesterol and triglyceride levels. ? Increasing the levels of good cholesterol. ? Lowering blood glucose levels.  What is my activity plan? Your health care provider or certified diabetes educator can help you make a plan for the type and frequency of exercise  (activity plan) that works for you. Make sure that you:  Do at least 150 minutes of moderate-intensity or vigorous-intensity exercise each week. This could be brisk walking, biking, or water aerobics. ? Do stretching and strength exercises, such as yoga or weightlifting, at least 2 times a week. ? Spread out your activity over at least 3 days of the week.  Get some form of physical activity every day. ? Do not go more than 2 days in a row without some kind of physical activity. ? Avoid being inactive for more than 90 minutes at a time. Take frequent breaks to walk or stretch.  Choose a type of exercise or activity that you enjoy, and set realistic goals.  Start slowly, and gradually increase the intensity of your exercise over time.  What do I need to know about managing my diabetes?  Check your blood glucose before and  after exercising. ? If your blood glucose is higher than 240 mg/dL (16.1 mmol/L) before you exercise, check your urine for ketones. If you have ketones in your urine, do not exercise until your blood glucose returns to normal.  Know the symptoms of low blood glucose (hypoglycemia) and how to treat it. Your risk for hypoglycemia increases during and after exercise. Common symptoms of hypoglycemia can include: ? Hunger. ? Anxiety. ? Sweating and feeling clammy. ? Confusion. ? Dizziness or feeling light-headed. ? Increased heart rate or palpitations. ? Blurry vision. ? Tingling or numbness around the mouth, lips, or tongue. ? Tremors or shakes. ? Irritability.  Keep a rapid-acting carbohydrate snack available before, during, and after exercise to help prevent or treat hypoglycemia.  Avoid injecting insulin into areas of the body that are going to be exercised. For example, avoid injecting insulin into: ? The arms, when playing tennis. ? The legs, when jogging.  Keep records of your exercise habits. Doing this can help you and your health care provider adjust your  diabetes management plan as needed. Write down: ? Food that you eat before and after you exercise. ? Blood glucose levels before and after you exercise. ? The type and amount of exercise you have done. ? When your insulin is expected to peak, if you use insulin. Avoid exercising at times when your insulin is peaking.  When you start a new exercise or activity, work with your health care provider to make sure the activity is safe for you, and to adjust your insulin, medicines, or food intake as needed.  Drink plenty of water while you exercise to prevent dehydration or heat stroke. Drink enough fluid to keep your urine clear or pale yellow. This information is not intended to replace advice given to you by your health care provider. Make sure you discuss any questions you have with your health care provider. Document Released: 08/08/2003 Document Revised: 12/06/2015 Document Reviewed: 10/28/2015 Elsevier Interactive Patient Education  2018 [employer].  Insulin Injection Instructions, Using Insulin Pens, Adult A subcutaneous injection is a shot of medicine that is injected into the layer of fat between skin and muscle. People with type 1 diabetes must take insulin because their bodies do not make it. People with type 2 diabetes may need to take insulin. There are many different types of insulin. The type of insulin that you take may determine how many injections you give yourself and when you need to take the injections. Choosing a site for injection Insulin absorption varies from site to site. It is best to inject insulin within the same body area, using a different spot in that area for each injection. Do not inject the insulin in the same spot for each injection. There are five main areas that can be used for injecting. These areas include:  Abdomen. This is the preferred area.  Front of thigh.  Upper, outer side of thigh.  Back of upper arm.  Buttocks.  Using an insulin pen First,  follow the steps for Getting Ready, then continue with the steps for Injecting the Insulin. Getting Ready 1. Wash your hands with soap and water. If soap and water are not available, use hand sanitizer. 2. Check the expiration date and type of insulin in the pen. 3. If you are using CLEAR insulin, check to see that it is clear and free of clumps. 4. If you are using CLOUDY insulin, gently roll the pen between your palms several times, or tip the pen  up and down several times to mix up the medicine. Do not shake the pen. 5. Remove the cap from the insulin pen. 6. Use an alcohol wipe to clean the rubber stopper of the pen cartridge. 7. Remove the protective paper tab from the disposable needle. Do not let the needle touch anything. 8. Screw the needle onto the pen. 9. Remove the outer and inner plastic covers from the needle. Do not throw away the outer plastic cover yet. 10. Prime the insulin pen by turning the button (dial) to 2 units. Hold the pen with the needle pointing up, and push the button on the opposite end of the pen until a drop of insulin appears at the needle tip. If no insulin appears, repeat this step. 11. Dial the number of units of insulin that you will be injecting. Injecting the Insulin  1. Use an alcohol wipe to clean the site where you will be injecting the needle. Let the site air-dry. 2. Hold the pen in the palm of your writing hand with your thumb on the top. 3. If directed by your health care provider, use your other hand to pinch and hold about an inch of skin at the injection site. Do not directly touch the cleaned part of the skin. 4. Gently but quickly, put the needle straight into the skin. The needle should be at a 90-degree angle (perpendicular) to the skin, as if to form the letter "L." ? For example, if you are giving an injection in the abdomen, the abdomen forms one "leg" of the "L" and the needle forms the other "leg" of the "L." 5. For adults who have a small  amount of body fat, the needle may need to be injected at a 45-degree angle instead. Your health care provider will tell you if this is necessary. ? A 45-degree angle looks like the letter "V." 6. When the needle is completely inserted into the skin, use the thumb of your writing hand to push the top button of the pen down all the way to inject the insulin. 7. Let go of the skin that you are pinching. Continue to hold the pen in place with your writing hand. 8. Wait five seconds, then pull the needle straight out of the skin. 9. Carefully put the larger (outer) plastic cover of the needle back over the needle, then unscrew the capped needle and discard it in a sharps container, such as an empty plastic bottle with a cover. 10. Put the plastic cap back on the insulin pen. Throwing away supplies  Discard all used needles in a puncture-proof sharps disposal container. You can ask your local pharmacy about where you can get this kind of disposal container, or you can use an empty liquid laundry detergent bottle that has a cover.  Follow the disposal regulations for the area where you live. Do not use any needle more than one time.  Throw away empty disposable pens in the regular trash. What questions should I ask my health care provider?  How often should I be taking insulin?  How often should I check my blood glucose?  What amount of insulin should I be taking at each time?  What are the side effects?  What should I do if my blood glucose is too high?  What should I do if my blood glucose is too low?  What should I do if I forget to take my insulin?  What number should I call if I have questions?  Where can I get more information?  American Diabetes Association (ADA): www.diabetes.org  American Association of Diabetes Educators (AADE) Patient Resources: https://www.diabeteseducator.org/patient-resources This information is not intended to replace advice given to you by your health  care provider. Make sure you discuss any questions you have with your health care provider. Document Released: 06/21/2015 Document Revised: 10/24/2015 Document Reviewed: 06/21/2015 Elsevier Interactive Patient Education  [employer].

## 2017-11-01 NOTE — Telephone Encounter (Signed)
Rx was sent to pharmacy this morning after pt visit

## 2017-11-02 ENCOUNTER — Ambulatory Visit (INDEPENDENT_AMBULATORY_CARE_PROVIDER_SITE_OTHER): Payer: No Typology Code available for payment source | Admitting: Advanced Practice Midwife

## 2017-11-02 ENCOUNTER — Telehealth: Payer: Self-pay | Admitting: Endocrinology

## 2017-11-02 ENCOUNTER — Encounter: Payer: Self-pay | Admitting: Advanced Practice Midwife

## 2017-11-02 VITALS — BP 134/97 | Wt 308.7 lb

## 2017-11-02 DIAGNOSIS — Z1151 Encounter for screening for human papillomavirus (HPV): Secondary | ICD-10-CM

## 2017-11-02 DIAGNOSIS — Z113 Encounter for screening for infections with a predominantly sexual mode of transmission: Secondary | ICD-10-CM

## 2017-11-02 DIAGNOSIS — Z124 Encounter for screening for malignant neoplasm of cervix: Secondary | ICD-10-CM

## 2017-11-02 DIAGNOSIS — Z3042 Encounter for surveillance of injectable contraceptive: Secondary | ICD-10-CM

## 2017-11-02 DIAGNOSIS — Z01419 Encounter for gynecological examination (general) (routine) without abnormal findings: Secondary | ICD-10-CM | POA: Diagnosis not present

## 2017-11-02 MED ORDER — MEDROXYPROGESTERONE ACETATE 150 MG/ML IM SUSP: 150.0000 mg | INTRAMUSCULAR | 4 refills | Status: DC

## 2017-11-02 NOTE — Progress Notes (Signed)
GYNECOLOGY ANNUAL PREVENTATIVE CARE ENCOUNTER NOTE  Subjective:   Miranda Stevens is a 37 y.o. G0P0 female here for a routine annual gynecologic exam.  Current complaints: some irregular bleeding with depo.   Denies abnormal vaginal bleeding, discharge, pelvic pain, problems with intercourse or other gynecologic concerns.    Gynecologic History Patient's last menstrual period was 10/19/2017. Contraception: Depo-Provera injections Last injection 08/19/17, due 11/04/17  Last Pap: 2018. Results were: normal Last mammogram: NA. Results were: normal  Obstetric History OB History  Gravida Para Term Preterm AB Living  0         0  SAB TAB Ectopic Multiple Live Births               Past Medical History:  Diagnosis Date  . Asthma   . Diabetes mellitus without complication (HCC)   . Headache(784.0)   . High cholesterol   . Migraine   . Migraine   . Pericarditis   . Polycystic disease, ovaries     Past Surgical History:  Procedure Laterality Date  . BIOPSY ENDOMETRIAL      Current Outpatient Medications on File Prior to Visit  Medication Sig Dispense Refill  . acetaminophen (TYLENOL) 500 MG tablet Take 1,000 mg by mouth every 6 (six) hours as needed. Pain      . albuterol (PROVENTIL HFA;VENTOLIN HFA) 108 (90 Base) MCG/ACT inhaler Inhale 1-2 puffs into the lungs every 6 (six) hours as needed for wheezing or shortness of breath.    Marland Kitchen atorvastatin (LIPITOR) 10 MG tablet Take 1 tablet (10 mg total) by mouth daily. 30 tablet 0  . glimepiride (AMARYL) 4 MG tablet Take 4 mg by mouth daily with breakfast.    . Insulin Glargine (LANTUS) 100 UNIT/ML Solostar Pen Inject 30 Units into the skin at bedtime. 15 mL 11  . insulin lispro (HUMALOG) 100 UNIT/ML KiwkPen Inject 0.1 mLs (10 Units total) into the skin 3 (three) times daily. 15 mL 11  . Insulin Pen Needle (PEN NEEDLES) 32G X 6 MM MISC 1 each by Subcutaneous Infusion route as directed. 100 each 11  . medroxyPROGESTERone (DEPO-PROVERA) 150  MG/ML injection Inject 1 mL (150 mg total) into the muscle every 3 (three) months. 1 mL 4  . metFORMIN (GLUCOPHAGE) 500 MG tablet Take 1 tablet (500 mg total) by mouth 2 (two) times daily with a meal. 60 tablet 6  . rizatriptan (MAXALT) 10 MG tablet Take 10 mg by mouth as needed for migraine. May repeat in 2 hours if needed    . triamcinolone cream (KENALOG) 0.1 % Apply topically 2 (two) times daily. Apply to affected area 30 g 0   No current facility-administered medications on file prior to visit.     Allergies  Allergen Reactions  . Aspirin Other (See Comments)    NO aspirin    Social History   Socioeconomic History  . Marital status: Single    Spouse name: Not on file  . Number of children: Not on file  . Years of education: Not on file  . Highest education level: Not on file  Occupational History  . Not on file  Social Needs  . Financial resource strain: Not on file  . Food insecurity:    Worry: Not on file    Inability: Not on file  . Transportation needs:    Medical: Not on file    Non-medical: Not on file  Tobacco Use  . Smoking status: Former Smoker    Years: 10.00  Types: Cigarettes    Last attempt to quit: 01/31/2011    Years since quitting: 6.7  . Smokeless tobacco: Never Used  Substance and Sexual Activity  . Alcohol use: Yes    Comment: social  . Drug use: Yes    Types: Marijuana    Comment: laswt used 1 week ago  . Sexual activity: Not Currently  Lifestyle  . Physical activity:    Days per week: Not on file    Minutes per session: Not on file  . Stress: Not on file  Relationships  . Social connections:    Talks on phone: Not on file    Gets together: Not on file    Attends religious service: Not on file    Active member of club or organization: Not on file    Attends meetings of clubs or organizations: Not on file    Relationship status: Not on file  . Intimate partner violence:    Fear of current or ex partner: Not on file    Emotionally  abused: Not on file    Physically abused: Not on file    Forced sexual activity: Not on file  Other Topics Concern  . Not on file  Social History Narrative  . Not on file    Family History  Problem Relation Age of Onset  . Lung disease Mother   . Diabetes Mother   . Heart disease Mother   . Hyperlipidemia Father     The following portions of the patient's history were reviewed and updated as appropriate: allergies, current medications, past family history, past medical history, past social history, past surgical history and problem list.  Review of Systems Pertinent items noted in HPI and remainder of comprehensive ROS otherwise negative.   Objective:  BP (!) 134/97   Wt (!) 308 lb 11.2 oz (140 kg)   LMP 10/19/2017   BMI 41.87 kg/m  CONSTITUTIONAL: Well-developed, well-nourished female in no acute distress.  HENT:  Normocephalic, atraumatic, External right and left ear normal. Oropharynx is clear and moist EYES: Conjunctivae and EOM are normal. Pupils are equal, round, and reactive to light. No scleral icterus.  NECK: Normal range of motion, supple, no masses.  Normal thyroid.  SKIN: Skin is warm and dry. No rash noted. Not diaphoretic. No erythema. No pallor. NEUROLOGIC: Alert and oriented to person, place, and time. Normal reflexes, muscle tone coordination. No cranial nerve deficit noted. PSYCHIATRIC: Normal mood and affect. Normal behavior. Normal judgment and thought content. CARDIOVASCULAR: Normal heart rate noted, regular rhythm RESPIRATORY: Clear to auscultation bilaterally. Effort and breath sounds normal, no problems with respiration noted. BREASTS: Symmetric in size. No masses, skin changes, nipple drainage, or lymphadenopathy. ABDOMEN: Soft, normal bowel sounds, no distention noted.  No tenderness, rebound or guarding.  PELVIC: Normal appearing external genitalia; normal appearing vaginal mucosa and cervix.  No abnormal discharge noted.  Pap smear obtained.  Normal  uterine size, no other palpable masses, no uterine or adnexal tenderness. MUSCULOSKELETAL: Normal range of motion. No tenderness.  No cyanosis, clubbing, or edema.  2+ distal pulses.   Assessment and Plan:  1. Well woman exam with routine gynecological exam - Cytology - PAP - HIV antibody (with reflex) - Hepatitis B Surface AntiGEN - Hepatitis C Antibody - RPR  Will follow up results of pap smear and manage accordingly. Routine preventative health maintenance measures emphasized. Please refer to After Visit Summary for other counseling recommendations.

## 2017-11-02 NOTE — Patient Instructions (Signed)

## 2017-11-02 NOTE — Telephone Encounter (Signed)
Patient stated she is returning your call.  

## 2017-11-03 LAB — HEPATITIS C ANTIBODY

## 2017-11-03 LAB — HIV ANTIBODY (ROUTINE TESTING W REFLEX): HIV SCREEN 4TH GENERATION: NONREACTIVE

## 2017-11-03 LAB — HEPATITIS B SURFACE ANTIGEN: HEP B S AG: NEGATIVE

## 2017-11-03 LAB — RPR: RPR Ser Ql: NONREACTIVE

## 2017-11-03 NOTE — Telephone Encounter (Signed)
Form has been received awaiting Dr Salomon FickBanks to complete

## 2017-11-03 NOTE — Telephone Encounter (Signed)
Pt states that [employer] was going to fax over paperwork to help her get assistance paying for the medicine and she wants to know if the office received this so she can come sign the form as well. Please advise.

## 2017-11-04 ENCOUNTER — Ambulatory Visit (INDEPENDENT_AMBULATORY_CARE_PROVIDER_SITE_OTHER): Payer: No Typology Code available for payment source | Admitting: *Deleted

## 2017-11-04 VITALS — BP 139/81 | HR 62 | Ht 72.0 in | Wt 306.2 lb

## 2017-11-04 DIAGNOSIS — Z3042 Encounter for surveillance of injectable contraceptive: Secondary | ICD-10-CM

## 2017-11-04 MED ORDER — MEDROXYPROGESTERONE ACETATE 150 MG/ML IM SUSP
150.0000 mg | Freq: Once | INTRAMUSCULAR | Status: AC
  Administered 2017-11-04: 150 mg via INTRAMUSCULAR

## 2017-11-04 NOTE — Progress Notes (Addendum)
Pt brought in Depo Provera 150 mg received from [facility]. Medication administered IM as scheduled. Pt tolerated well.  Next dose due 8/22-9/5  Reviewed chart and note and in agreement with plan.  Nolene BernheimERRI BURLESON, RN, MSN, NP-BC Nurse Practitioner, [facility], [facility] 11/04/2017 5:05 PM

## 2017-11-05 LAB — CYTOLOGY - PAP
Chlamydia: NEGATIVE
Diagnosis: NEGATIVE
HPV: NOT DETECTED
NEISSERIA GONORRHEA: NEGATIVE
Trichomonas: NEGATIVE

## 2017-11-05 NOTE — Telephone Encounter (Signed)
Pt calling to check status on her Lantus form being completed and on her Humalog rx being changed to 30 days. She is fixing to leave town for 2 months and is needing these medications.

## 2017-11-08 ENCOUNTER — Other Ambulatory Visit: Payer: Self-pay | Admitting: *Deleted

## 2017-11-08 DIAGNOSIS — E1165 Type 2 diabetes mellitus with hyperglycemia: Principal | ICD-10-CM

## 2017-11-08 DIAGNOSIS — IMO0001 Reserved for inherently not codable concepts without codable children: Secondary | ICD-10-CM

## 2017-11-08 MED ORDER — INSULIN LISPRO 100 UNIT/ML (KWIKPEN)
10.0000 [IU] | PEN_INJECTOR | Freq: Three times a day (TID) | SUBCUTANEOUS | 11 refills | Status: DC
Start: 2017-11-08 — End: 2017-11-10

## 2017-11-08 NOTE — Telephone Encounter (Signed)
11/08/17 12:37 PM     Patient came into office requesting [facility]og be [facility] [place] [facility] of [facility]. She has a coupon from [place] that she can use at [facility]. Medication filled to pharmacy as requested.

## 2017-11-08 NOTE — Telephone Encounter (Signed)
Patient returned to office stating she was still unable to use the coupon and that rx needed to be sent for 1 box.  Called pharmacy to clarify, as the last rx sent was for 1 box of pens.  Pharmacist stated that the coupon can only be used for 1 box (5 pens)/month and that the patient's monthly dosing must also equal 5 pens worth. Patient's monthly dosing only equals 9 mLs/3 pens total, so patient cannot use the coupon provider.   Patient states her OOP for the Humalog is $900 and she cannot afford this. She currently has no insulin and she is also waiting on patient assistance paperwork to be faxed to [employer] for Lantus.  Please advise if there is an alternative for Humalog.

## 2017-11-08 NOTE — Telephone Encounter (Signed)
Patient came into office requesting Humalog be sent to [facility] instead of [facility]. She has a coupon from [place] that she can use at [facility]. Medication filled to pharmacy as requested.

## 2017-11-09 NOTE — Telephone Encounter (Signed)
Pt can use novolog of the generic of these short acting insulins but they may still be expensive.

## 2017-11-09 NOTE — Telephone Encounter (Signed)
Please Advise

## 2017-11-10 ENCOUNTER — Telehealth: Payer: Self-pay

## 2017-11-10 MED ORDER — INSULIN ASPART 100 UNIT/ML FLEXPEN: 10.0000 [IU] | PEN_INJECTOR | Freq: Three times a day (TID) | SUBCUTANEOUS | 11 refills | Status: DC

## 2017-11-10 NOTE — Telephone Encounter (Signed)
Spoke w/ patient and she would like Novolog sent to [facility] [address] [place]  Will verify dosing w/ prescriber and send rx.

## 2017-11-10 NOTE — Addendum Note (Signed)
Addended by: Starla LinkAKLEY, Christiona Siddique J on: 11/10/2017 11:41 AM   Modules accepted: Orders

## 2017-11-10 NOTE — Telephone Encounter (Signed)
Pt Lantus forms have been been completed and faxed to [employer] [facility] for approval, pt is aware

## 2017-11-10 NOTE — Telephone Encounter (Signed)
Pt form from [employer] has been completed and faxed to the office ([employer]) per pt request.Pt is aware.

## 2017-11-10 NOTE — Telephone Encounter (Addendum)
Discussed w/ Dr. Onalee HuaBanks-ok to send Novolog w/ same dose as Humalog. Also suggested checking w/ [facility] to see if they had OTC insulin that might be more affordable. Called local [facility] and they only have Novolin N, Novolin R, and 70/30 available OTC, no rapid acting insulins.   Medication filled to pharmacy in [name] as requested. Patient will check GoodRx for coupons.  Patient Assistance Paperwork for Lantus is still pending completion. Miranda Stevens, please make sure this is faxed in today. Thanks!

## 2018-01-06 ENCOUNTER — Encounter: Payer: No Typology Code available for payment source | Attending: Family Medicine | Admitting: Dietician

## 2018-01-06 ENCOUNTER — Encounter: Payer: Self-pay | Admitting: Dietician

## 2018-01-06 DIAGNOSIS — Z713 Dietary counseling and surveillance: Secondary | ICD-10-CM | POA: Diagnosis present

## 2018-01-06 DIAGNOSIS — E1165 Type 2 diabetes mellitus with hyperglycemia: Secondary | ICD-10-CM | POA: Insufficient documentation

## 2018-01-06 DIAGNOSIS — IMO0001 Reserved for inherently not codable concepts without codable children: Secondary | ICD-10-CM

## 2018-01-06 NOTE — Patient Instructions (Signed)
Continue the great changes that you have made. Start back with exercise.  Aim for at least 30 minutes 5-7 days per week. Decrease fat especially saturated fat (sausage, chicken skin). Consider basic meal planning. Discuss the insulin with your MD now that your blood sugar has come down.  Aim for 2-3 Carb Choices per meal (30-45 grams) +/- 1 either way  Aim for 0-1 Carbs per snack if hungry

## 2018-01-06 NOTE — Progress Notes (Signed)
Diabetes Self-Management Education  Visit Type: First/Initial  Appt. Start Time: 1040 Appt. End Time: 1215  01/06/2018  Miranda Stevens, identified by name and date of birth, is a 37 y.o. female with a diagnosis of Diabetes: Type 2. Other history includes migraines, PCOS, HLD, pericarditis, asthma.  She has a referral to see Dr. Lucianne MussKumar.  Weight hx: 318 lbs today 340 lbs 1 year ago.  Lost by "doing nothing".  Started to eat more regularly. Cannot remember her lower weight. Would like to lose to <200 lbs.  Medications include:  Metformin ER (unable to tolerate general Metformin due to severe diarrhea), Glimepiride, Lantus and Humalog.  She has not started the insulin as she cannot afford this.  She has worked to obtain these with support.  Since insulin was prescribe.  Her blood sugar has decreased- often WNL fasting and rises 30 points with meals but generally only checks her blood sugar in the am.  Discussed to discuss with her MD as their are less expensive insulin's available at [facility].  She has stopped using regular soda and juice which has helped lower her blood sugar.  Patient travels between [name] and [place] and lives with her sister who has Autism or Father.  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  She has insurance but this has not drug coverage.  She was prescribed insulin but cannot afford this. Used to go to the gym but has not recently but plans too this week.  She has a lot of friends encouraging her with this.  ASSESSMENT  Height 6' (1.829 m), weight (!) 318 lb (144.2 kg). Body mass index is 43.13 kg/m.  Diabetes Self-Management Education - 01/06/18 1112      Visit Information   Visit Type  First/Initial      Initial Visit   Diabetes Type  Type 2    Are you currently following a meal plan?  No    Are you taking your medications as prescribed?  No    Date Diagnosed  2012      Health Coping   How would you rate your overall health?   Fair      Psychosocial Assessment   Patient Belief/Attitude about Diabetes  Afraid    Self-care barriers  None    Self-management support  Doctor's office    Other persons present  Patient    Patient Concerns  Nutrition/Meal planning;Glycemic Control;Problem Solving    Special Needs  None    Preferred Learning Style  No preference indicated    Learning Readiness  Ready    How often do you need to have someone help you when you read instructions, pamphlets, or other written materials from your doctor or pharmacy?  1 - Never    What is the last grade level you completed in school?  2 years college      Pre-Education Assessment   Patient understands the diabetes disease and treatment process.  Needs Instruction    Patient understands incorporating nutritional management into lifestyle.  Needs Instruction    Patient undertands incorporating physical activity into lifestyle.  Needs Instruction    Patient understands using medications safely.  Needs Instruction    Patient understands monitoring blood glucose, interpreting and using results  Needs Instruction    Patient understands prevention, detection, and treatment of acute complications.  Needs Instruction    Patient understands prevention, detection, and treatment of chronic complications.  Needs Instruction    Patient understands how to develop  strategies to address psychosocial issues.  Needs Instruction    Patient understands how to develop strategies to promote health/change behavior.  Needs Instruction      Complications   Last HgB A1C per patient/outside source  12.3 %    How often do you check your blood sugar?  1-2 times/day    Fasting Blood glucose range (mg/dL)  11-914;782-956    Postprandial Blood glucose range (mg/dL)  >213;086-578    Number of hypoglycemic episodes per month  0    Number of hyperglycemic episodes per week  7    Can you tell when your blood sugar is high?  Yes    What do you do if your blood sugar is high?   went to the ER    Have you had a dilated eye exam in the past 12 months?  Yes    Have you had a dental exam in the past 12 months?  Yes    Are you checking your feet?  Yes    How many days per week are you checking your feet?  1      Dietary Intake   Breakfast  fruit, sausage, egg OR oatmeal, butter, artificial sweetener, sausage links OR crackers and cheese, fruit    Snack (morning)  none    Lunch  leftovers OR [place] sandwich, lettuce or spinach, [employer] bread, cheese with mayo, fruit or chips    Snack (afternoon)  fruit, cheese    Dinner  Chicken, bean, and corn salad from [employer] OR cookout burger without bun, onion rings occasionally, OR greens or other vegetable, brown rice and tomatoes    Snack (evening)  occasional no sugar ice cream OR sugar free pop sickle OR sugar free chocolate chip cookies    Beverage(s)  water, flavored water, diet coke (2)      Exercise   Exercise Type  ADL's      Patient Education   Previous Diabetes Education  No    Disease state   Definition of diabetes, type 1 and 2, and the diagnosis of diabetes;Explored patient's options for treatment of their diabetes    Nutrition management   Role of diet in the treatment of diabetes and the relationship between the three main macronutrients and blood glucose level;Food label reading, portion sizes and measuring food.;Meal options for control of blood glucose level and chronic complications.;Effects of alcohol on blood glucose and safety factors with consumption of alcohol.;Meal timing in regards to the patients' current diabetes medication.;Reviewed blood glucose goals for pre and post meals and how to evaluate the patients' food intake on their blood glucose level.    Physical activity and exercise   Role of exercise on diabetes management, blood pressure control and cardiac health.    Medications  Reviewed patients medication for diabetes, action, purpose, timing of dose and side effects.    Monitoring  Purpose and  frequency of SMBG.;Daily foot exams;Identified appropriate SMBG and/or A1C goals.;Yearly dilated eye exam    Acute complications  Taught treatment of hypoglycemia - the 15 rule.;Discussed and identified patients' treatment of hyperglycemia.;Covered sick day management with medication and food.    Chronic complications  Assessed and discussed foot care and prevention of foot problems    Psychosocial adjustment  Role of stress on diabetes;Worked with patient to identify barriers to care and solutions;Identified and addressed patients feelings and concerns about diabetes      Individualized Goals (developed by patient)   Nutrition  General guidelines for healthy choices and  portions discussed    Physical Activity  Exercise 5-7 days per week;30 minutes per day    Medications  take my medication as prescribed    Monitoring   test my blood glucose as discussed    Problem Solving  insulin cost    Reducing Risk  examine blood glucose patterns    Health Coping  discuss diabetes with (comment)      Post-Education Assessment   Patient understands the diabetes disease and treatment process.  Demonstrates understanding / competency    Patient understands incorporating nutritional management into lifestyle.  Demonstrates understanding / competency    Patient undertands incorporating physical activity into lifestyle.  Demonstrates understanding / competency    Patient understands using medications safely.  Demonstrates understanding / competency    Patient understands monitoring blood glucose, interpreting and using results  Demonstrates understanding / competency    Patient understands prevention, detection, and treatment of acute complications.  Demonstrates understanding / competency    Patient understands prevention, detection, and treatment of chronic complications.  Demonstrates understanding / competency    Patient understands how to develop strategies to address psychosocial issues.  Demonstrates  understanding / competency    Patient understands how to develop strategies to promote health/change behavior.  Demonstrates understanding / competency      Outcomes   Expected Outcomes  Demonstrated interest in learning. Expect positive outcomes    Future DMSE  4-6 wks    Program Status  Not Completed       Individualized Plan for Diabetes Self-Management Training:   Learning Objective:  Patient will have a greater understanding of diabetes self-management. Patient education plan is to attend individual and/or group sessions per assessed needs and concerns.   Plan:   Patient Instructions  Continue the great changes that you have made. Start back with exercise.  Aim for at least 30 minutes 5-7 days per week. Decrease fat especially saturated fat (sausage, chicken skin). Consider basic meal planning. Discuss the insulin with your MD now that your blood sugar has come down.  Aim for 2-3 Carb Choices per meal (30-45 grams) +/- 1 either way  Aim for 0-1 Carbs per snack if hungry      Expected Outcomes:  Demonstrated interest in learning. Expect positive outcomes  Education material provided: ADA Diabetes: Your Take Control Guide, Food label handouts, Meal plan card, My Plate and Snack sheet  If problems or questions, patient to contact team via:  Phone  Future DSME appointment: 4-6 wks

## 2018-01-07 ENCOUNTER — Ambulatory Visit: Payer: No Typology Code available for payment source

## 2018-01-18 ENCOUNTER — Other Ambulatory Visit: Payer: Self-pay

## 2018-01-18 MED ORDER — ATORVASTATIN CALCIUM 10 MG PO TABS: 10.0000 mg | ORAL_TABLET | Freq: Every day | ORAL | 2 refills | Status: DC

## 2018-01-18 NOTE — Telephone Encounter (Signed)
Copied from CRM [phone]. Topic: General - Other >> Jan 18, 2018  2:31 PM Percival SpanishKennedy, Cheryl W wrote:  Pt is requesting a refill o nthe below med. Pt said it was RX by her doctor in [name] but [facility]arm[facility] [address]

## 2018-01-18 NOTE — Telephone Encounter (Signed)
Rx refills have been sent to pharmacy for refill as requested by patient.

## 2018-01-26 ENCOUNTER — Ambulatory Visit: Payer: No Typology Code available for payment source

## 2018-02-01 ENCOUNTER — Ambulatory Visit (INDEPENDENT_AMBULATORY_CARE_PROVIDER_SITE_OTHER): Payer: No Typology Code available for payment source

## 2018-02-01 DIAGNOSIS — Z3042 Encounter for surveillance of injectable contraceptive: Secondary | ICD-10-CM | POA: Diagnosis not present

## 2018-02-01 MED ORDER — MEDROXYPROGESTERONE ACETATE 150 MG/ML IM SUSP
150.0000 mg | Freq: Once | INTRAMUSCULAR | Status: AC
  Administered 2018-02-01: 150 mg via INTRAMUSCULAR

## 2018-02-01 MED ORDER — MEDROXYPROGESTERONE ACETATE 150 MG/ML IM SUSP: 150.0000 mg | Freq: Once | INTRAMUSCULAR | Status: DC

## 2018-02-01 NOTE — Progress Notes (Signed)
Miranda Stevens here for Depo-Provera  Injection.  Injection administered without complication. Patient will return in 3 months for next injection.  Ernestina Patches, CMA 02/01/2018  4:38 PM

## 2018-02-01 NOTE — Progress Notes (Signed)
I have reviewed the chart and agree with nursing staff's documentation of this patient's encounter.  Thressa Sheller, CNM 02/01/2018 5:14 PM

## 2018-02-24 ENCOUNTER — Encounter: Payer: Self-pay | Admitting: Endocrinology

## 2018-02-24 ENCOUNTER — Encounter: Payer: Self-pay | Admitting: Dietician

## 2018-02-24 ENCOUNTER — Ambulatory Visit (INDEPENDENT_AMBULATORY_CARE_PROVIDER_SITE_OTHER): Payer: No Typology Code available for payment source | Admitting: Endocrinology

## 2018-02-24 ENCOUNTER — Encounter: Payer: No Typology Code available for payment source | Attending: Family Medicine | Admitting: Dietician

## 2018-02-24 VITALS — BP 106/72 | HR 73 | Ht 71.0 in | Wt 324.0 lb

## 2018-02-24 DIAGNOSIS — E1165 Type 2 diabetes mellitus with hyperglycemia: Secondary | ICD-10-CM | POA: Diagnosis not present

## 2018-02-24 DIAGNOSIS — Z713 Dietary counseling and surveillance: Secondary | ICD-10-CM | POA: Diagnosis present

## 2018-02-24 DIAGNOSIS — Z794 Long term (current) use of insulin: Secondary | ICD-10-CM

## 2018-02-24 DIAGNOSIS — E282 Polycystic ovarian syndrome: Secondary | ICD-10-CM | POA: Diagnosis not present

## 2018-02-24 DIAGNOSIS — IMO0001 Reserved for inherently not codable concepts without codable children: Secondary | ICD-10-CM

## 2018-02-24 LAB — POCT GLYCOSYLATED HEMOGLOBIN (HGB A1C): Hemoglobin A1C: 7.4 % — AB (ref 4.0–5.6)

## 2018-02-24 LAB — GLUCOSE, POCT (MANUAL RESULT ENTRY): POC Glucose: 156 mg/dl — AB (ref 70–99)

## 2018-02-24 LAB — BASIC METABOLIC PANEL
BUN: 13 mg/dL (ref 6–23)
CHLORIDE: 103 meq/L (ref 96–112)
CO2: 25 meq/L (ref 19–32)
Calcium: 10 mg/dL (ref 8.4–10.5)
Creatinine, Ser: 0.95 mg/dL (ref 0.40–1.20)
GFR: 85.29 mL/min (ref >60.00)
Glucose, Bld: 145 mg/dL — ABNORMAL HIGH (ref 70–99)
POTASSIUM: 4 meq/L (ref 3.5–5.1)
Sodium: 137 mEq/L (ref 135–145)

## 2018-02-24 MED ORDER — CANAGLIFLOZIN 100 MG PO TABS: ORAL_TABLET | ORAL | 3 refills | Status: DC

## 2018-02-24 NOTE — Patient Instructions (Addendum)
Check blood sugars on waking up  3/7 days  Also check blood sugars about 2 hours after a meal and do this after different meals by rotation  Recommended blood sugar levels on waking up is 90-130 and about 2 hours after meal is 130-160  Please bring your blood sugar monitor to each visit, thank you  Invokana in am  Glimeperide 1/2 with this

## 2018-02-24 NOTE — Patient Instructions (Signed)
-  Avoid skipping meals -Choose low fat -Continue to avoid sweetened drinks -Continue to exercise most days of the week. (walking, jump rope, bands, arm exercises, gym). -Continue to check your blood sugar and take your medication as prescribed. -Aim for 2-3 Carbohydrate choices per meal and 0-1 for a snack when you are hungry.

## 2018-02-24 NOTE — Progress Notes (Signed)
Diabetes Self-Management Education  Visit Type:  Follow-up  Appt. Start Time: 1015 Appt. End Time: 1045  02/24/2018  Miranda Stevens, identified by name and date of birth, is a 37 y.o. female with a diagnosis of Diabetes: Type 2.  Other history includes migraines, PCOS, HLD, pericarditis, asthma.  She is a patient of Dr. Lucianne Muss.  Weight hx: 324 lbs today 318 lbs 01/06/18 340 lbs 1 year ago and lost by "doing nothing".  Started to eat more regularly. Cannot remember her lower weight Would like to lose to <200 lbs  Medications include:  Metformin ER (unable to tolerate general Metformin due to severe diarrhea), glimepiried, Lantus and Novolog.  Patient never started the Lantis or Novolog as she could not afford them and paperwork for the financial assistance never went through. She got the Relion meter and has been checking her blood sugar.  Patient lives with her sister who has Autism.  She has not been back to [place] since before August.  She has a new job and is to start at [employer] on 10/14.  She has started to walk but complains that her feet hurt afterwards.  We discussed alternative exercise options. She continues on Depo injection due to very hard periods with PCOS.  Discussed this as a contributor to weight gain.  She also may have gained to better controlled blood sugar.  A1C has decreased to 7.4% 02/24/18 which has decreased from 12.6%.  She did this with avoiding juice and sugar containing drinks, starting to exercise, shortening eating window, and decreasing her overall intake.  She was able to get Emergency Food stamps for 3 months so now is able to afford food.  ASSESSMENT  Weight (!) 324 lb (147 kg). Body mass index is 43.94 kg/m.   Diabetes Self-Management Education - 02/24/18 1437      Psychosocial Assessment   Patient Belief/Attitude about Diabetes  Motivated to manage diabetes    Self-care barriers  None    Self-management support  Doctor's office     Patient Concerns  Nutrition/Meal planning;Glycemic Control;Weight Control    Special Needs  None    Preferred Learning Style  No preference indicated    Learning Readiness  Ready      Pre-Education Assessment   Patient understands the diabetes disease and treatment process.  Needs Review    Patient understands incorporating nutritional management into lifestyle.  Needs Review    Patient undertands incorporating physical activity into lifestyle.  Demonstrates understanding / competency    Patient understands using medications safely.  Needs Review    Patient understands monitoring blood glucose, interpreting and using results  Demonstrates understanding / competency    Patient understands prevention, detection, and treatment of acute complications.  Demonstrates understanding / competency    Patient understands prevention, detection, and treatment of chronic complications.  Demonstrates understanding / competency    Patient understands how to develop strategies to address psychosocial issues.  Demonstrates understanding / competency    Patient understands how to develop strategies to promote health/change behavior.  Demonstrates understanding / competency      Complications   Last HgB A1C per patient/outside source  7.4 %   01/2018 decreased from 12.3% 10/2017   How often do you check your blood sugar?  1-2 times/day    Fasting Blood glucose range (mg/dL)  40-102;725-366      Dietary Intake   Breakfast  skipping again    Snack (morning)  none    Lunch  skipped yesturdat  Snack (afternoon)  none    Dinner  roasted chicken with skin, greens, baked beans, diet soda at [employer] (evening)  [place] sandwich on [employer] with [employer])  water, flavored water, diet soda      Exercise   Exercise Type  Light (walking / raking leaves)    How many days per week to you exercise?  3    How many minutes per day do you exercise?  40    Total minutes per week of exercise  120       Patient Education   Previous Diabetes Education  Yes (please comment)   6 weeks ago   Nutrition management   Meal timing in regards to the patients' current diabetes medication.;Meal options for control of blood glucose level and chronic complications.    Physical activity and exercise   Other (comment);Helped patient identify appropriate exercises in relation to his/her diabetes, diabetes complications and other health issue.   reviewed options   Medications  Reviewed patients medication for diabetes, action, purpose, timing of dose and side effects.    Monitoring  Purpose and frequency of SMBG.;Taught/discussed recording of test results and interpretation of SMBG.    Psychosocial adjustment  Worked with patient to identify barriers to care and solutions      Individualized Goals (developed by patient)   Nutrition  General guidelines for healthy choices and portions discussed    Physical Activity  Exercise 5-7 days per week;30 minutes per day    Medications  take my medication as prescribed    Monitoring   test my blood glucose as discussed    Problem Solving  meal planning    Reducing Risk  examine blood glucose patterns    Health Coping  discuss diabetes with (comment)   MD, RD, CDE     Patient Self-Evaluation of Goals - Patient rates self as meeting previously set goals (% of time)   Nutrition  >75%    Physical Activity  50 - 75 %    Medications  25 - 50%    Monitoring  50 - 75 %    Problem Solving  50 - 75 %    Reducing Risk  50 - 75 %    Health Coping  >75%      Post-Education Assessment   Patient understands the diabetes disease and treatment process.  Demonstrates understanding / competency    Patient understands incorporating nutritional management into lifestyle.  Needs Review    Patient undertands incorporating physical activity into lifestyle.  Demonstrates understanding / competency    Patient understands using medications safely.  Demonstrates understanding / competency     Patient understands monitoring blood glucose, interpreting and using results  Demonstrates understanding / competency    Patient understands prevention, detection, and treatment of acute complications.  Demonstrates understanding / competency    Patient understands prevention, detection, and treatment of chronic complications.  Demonstrates understanding / competency    Patient understands how to develop strategies to address psychosocial issues.  Demonstrates understanding / competency    Patient understands how to develop strategies to promote health/change behavior.  Demonstrates understanding / competency      Outcomes   Program Status  Completed      Subsequent Visit   Since your last visit have you continued or begun to take your medications as prescribed?  No    Since your last visit have you had your blood pressure checked?  Yes  Since your last visit have you experienced any weight changes?  Gain    Weight Gain (lbs)  6    Since your last visit, are you checking your blood glucose at least once a day?  Yes       Learning Objective:  Patient will have a greater understanding of diabetes self-management. Patient education plan is to attend individual and/or group sessions per assessed needs and concerns.   Plan:   Patient Instructions  -Avoid skipping meals -Choose low fat -Continue to avoid sweetened drinks -Continue to exercise most days of the week. (walking, jump rope, bands, arm exercises, gym). -Continue to check your blood sugar and take your medication as prescribed. -Aim for 2-3 Carbohydrate choices per meal and 0-1 for a snack when you are hungry.     Expected Outcomes:  Demonstrated interest in learning. Expect positive outcomes  Education material provided: Breakfast ideas  If problems or questions, patient to contact team via:  Phone  Future DSME appointment: - PRN

## 2018-02-24 NOTE — Progress Notes (Signed)
Patient ID: Miranda Stevens, female   DOB: [birth year], 37 y.o.   MRN: [id number]           Reason for Appointment: Consultation for Type 2 Diabetes  Referring physician: Abbe Amsterdam   History of Present Illness:          Date of diagnosis of type 2 diabetes mellitus: 2013       Background history:  She was diagnosed to have diabetes as part of her evaluation for PCOS with a glucose tolerance test She was then put on metformin which she has continued Most of her diabetes care has been in [place] and no records are available Because of her sugars going up last year Amaryl was added to her and metformin continued unchanged Not clear what her A1c was at that time  Recent history:   Most recent A1c is 7.4 compared to 12.6 in June  INSULIN regimen is: none      Non-insulin hypoglycemic drugs the patient is taking are: Metformin ER 2g, Amaryl 4 mg in the morning  Current management, blood sugar patterns and problems identified:  She has had difficulty affording medications because of lack of insurance and although she was supposed to start insulin in June when her blood sugar was as high as 391 she had not been able to start insulin as yet through the assistance program  However she was seen by the dietitian in August and she thinks she has made significant changes in her diet when she was told to start insulin  She has cut back on sweets mostly as well as drinks with sugar, high fat snacks and is trying to cut back on fried foods  She is also trying to walk when she can although not more than 3 times a week usually  Her weight appears to be higher than in August  She did not bring her blood sugar monitor but she thinks her blood sugars recently are mostly below 150 both morning and bedtime although checking mostly in the morning          Side effects from medications have been: Diarrhea from regular metformin  Compliance with the medical regimen: Improving  Typical meal  intake: Breakfast is frequently skipped                Exercise:  Walking 1-3 times a week   Glucose monitoring:  done 1-2 times a day         Glucometer:  [employer]       Blood Glucose readings by time of day and averages from meter download:  PREMEAL Breakfast Lunch Dinner Bedtime  Overall   Glucose range: 123-181   150   Median:        POST-MEAL PC Breakfast PC Lunch PC Dinner  Glucose range:  ? ?  Median:       Dietician visit, most recent: 02/24/2018  Weight history:  Wt Readings from Last 3 Encounters:  02/24/18 (!) 324 lb (147 kg)  02/24/18 (!) 324 lb (147 kg)  01/06/18 (!) 318 lb (144.2 kg)    Glycemic control:   Lab Results  Component Value Date   HGBA1C 7.4 (A) 02/24/2018   HGBA1C 12.6 (A) 11/01/2017   Lab Results  Component Value Date   CREATININE 1.04 (H) 09/14/2017   No results found for: MICRALBCREAT  No results found for: FRUCTOSAMINE    Allergies as of 02/24/2018      Reactions   Aspirin Other (See Comments)  NO aspirin      Medication List        Accurate as of 02/24/18  1:09 PM. Always use your most recent med list.          acetaminophen 500 MG tablet Commonly known as:  TYLENOL Take 1,000 mg by mouth every 6 (six) hours as needed. Pain   albuterol 108 (90 Base) MCG/ACT inhaler Commonly known as:  PROVENTIL HFA;VENTOLIN HFA Inhale 1-2 puffs into the lungs every 6 (six) hours as needed for wheezing or shortness of breath.   atorvastatin 10 MG tablet Commonly known as:  LIPITOR Take 1 tablet (10 mg total) by mouth daily.   canagliflozin 100 MG Tabs tablet Commonly known as:  INVOKANA 1 tablet before breakfast   CINNAMON PO Take by mouth.   glimepiride 4 MG tablet Commonly known as:  AMARYL Take 4 mg by mouth daily with breakfast.   Insulin Glargine 100 UNIT/ML Solostar Pen Commonly known as:  LANTUS Inject 30 Units into the skin at bedtime.   medroxyPROGESTERone 150 MG/ML injection Commonly known as:   DEPO-PROVERA Inject 1 mL (150 mg total) into the muscle every 3 (three) months.   metFORMIN 500 MG 24 hr tablet Commonly known as:  GLUCOPHAGE-XR Take 500 mg by mouth daily with breakfast.   Pen Needles 32G X 6 MM Misc 1 each by Subcutaneous Infusion route as directed.   rizatriptan 10 MG tablet Commonly known as:  MAXALT Take 10 mg by mouth as needed for migraine. May repeat in 2 hours if needed   triamcinolone cream 0.1 % Commonly known as:  KENALOG Apply topically 2 (two) times daily. Apply to affected area       Allergies:  Allergies  Allergen Reactions  . Aspirin Other (See Comments)    NO aspirin    Past Medical History:  Diagnosis Date  . Asthma   . Diabetes mellitus without complication (HCC)   . Headache(784.0)   . High cholesterol   . Migraine   . Migraine   . Pericarditis   . Polycystic disease, ovaries     Past Surgical History:  Procedure Laterality Date  . BIOPSY ENDOMETRIAL      Family History  Problem Relation Age of Onset  . Lung disease Mother   . Diabetes Mother   . Heart disease Mother   . Hyperlipidemia Father     Social History:  reports that she quit smoking about 7 years ago. Her smoking use included cigarettes. She quit after 10.00 years of use. She has never used smokeless tobacco. She reports that she drank alcohol. She reports that she has current or past drug history.   Review of Systems  Constitutional: Positive for malaise.  HENT: Negative for headaches.   Respiratory: Positive for shortness of breath.   Gastrointestinal: Negative for diarrhea.  Endocrine: Positive for menstrual changes and fatigue.       She is on Depo-Provera and does not have menstrual cycles  Genitourinary: Positive for frequency.  Musculoskeletal: Positive for joint pain.  Neurological: Negative for numbness and tingling.  Psychiatric/Behavioral: Positive for insomnia.Negative for depressed mood.     Lipid history: On treatment with Lipitor for  about a year, baseline lipids not available   No results found for: CHOL, HDL, LDLCALC, LDLDIRECT, TRIG, CHOLHDL         Hypertension: Has not been present  BP Readings from Last 3 Encounters:  02/24/18 106/72  11/04/17 139/81  11/02/17 (!) 134/97    Most  recent eye exam was in 2019  Most recent foot exam: 9/19  Currently known complications of diabetes: None  LABS:  Office Visit on 02/24/2018  Component Date Value Ref Range Status  . Hemoglobin A1C 02/24/2018 7.4* 4.0 - 5.6 % Final  . POC Glucose 02/24/2018 156* 70 - 99 mg/dl Final    Physical Examination:  BP 106/72   Pulse 73   Ht 5\' 11"  (1.803 m)   Wt (!) 324 lb (147 kg)   SpO2 99%   BMI 45.19 kg/m   GENERAL:         Patient has marked generalized obesity.    HEENT:         Eye exam shows normal external appearance.  Fundus exam shows no retinopathy.  Oral exam shows normal mucosa .   NECK:  She has acanthosis posteriorly  There is no lymphadenopathy  Thyroid is not enlarged and no nodules felt.    LUNGS:         Chest is symmetrical. Lungs are clear to auscultation.Marland Kitchen   HEART:         Heart sounds:  S1 and S2 are normal. No murmur or click heard., no S3 or S4.   ABDOMEN:  Marked obesity present There is no distention present. Liver and spleen are not palpable.  No other mass or tenderness present.    NEUROLOGICAL:   Ankle jerks and biceps reflexes are absent bilaterally.    Diabetic Foot Exam - Simple   Simple Foot Form Diabetic Foot exam was performed with the following findings:  Yes   Visual Inspection No deformities, no ulcerations, no other skin breakdown bilaterally:  Yes Sensation Testing Intact to touch and monofilament testing bilaterally:  Yes Pulse Check Posterior Tibialis and Dorsalis pulse intact bilaterally:  Yes Comments            Vibration sense is minimally reduced in distal first toes.  MUSCULOSKELETAL:  There is no swelling or deformity of the peripheral joints.       EXTREMITIES:     There is no ankle edema.  SKIN:       No rash.  Has mild fine hirsutism on the upper lip and chin    ASSESSMENT:  Diabetes type 2 with morbid obesity  See history of present illness for detailed discussion of current diabetes management, blood sugar patterns and problems identified  Her A1c 7.4 now but was 12.6 in June  Has been managed with metformin and Amaryl as only treatment at least for the last year Except recently she usually has had minimal motivation to make lifestyle changes and lose weight in the past However with her changing her diet significantly as well as starting to do some walking for exercise her blood sugars appear to be significantly better without any change in medications even with her blood sugar being as high as 391 in June  She does need considerable weight loss for long-term management of her diabetes to prevent progression which appears to be occurring over the last year or so She is a good candidate for either a GLP-1 drug or SGLT2 drug She prefers an oral medication however and has not been instructed on use of an injection device so far  Complications of diabetes: None evident  PCOS: She still has some residual facial hirsutism and likely still has high free testosterone levels This will likely improve when she is able to lose weight  Stevens:    Start Invokana 100 mg daily Discussed  action of SGLT 2 drugs on lowering glucose by decreasing kidney absorption of glucose, benefits of weight loss and lower blood pressure, possible side effects including candidiasis and dosage regimen  Patient assistance information given for her to apply for In the meantime she can use a 30-day free trial coupon for the Invokana  With starting Invokana she will reduce her Amaryl to half a tablet  Continue metformin  Check blood sugars consistently and keep a record of blood sugars including after meals  Will consider using a brand-name glucose monitor  once she has prescription drug coverage  Baseline BMP to be done today  Discussed blood sugar targets both fasting and after meals  She will need to be more consistent with exercise at least 3 to 4 days a week  She will continue to improve her diet also  Patient Instructions  Check blood sugars on waking up  3/7 days  Also check blood sugars about 2 hours after a meal and do this after different meals by rotation  Recommended blood sugar levels on waking up is 90-130 and about 2 hours after meal is 130-160  Please bring your blood sugar monitor to each visit, thank you  Invokana in am  Glimeperide 1/2 with this        Reather Littler 02/24/2018, 1:09 PM   Note: This office note was prepared with Dragon voice recognition system technology. Any transcriptional errors that result from this process are unintentional.

## 2018-02-28 ENCOUNTER — Telehealth: Payer: Self-pay

## 2018-02-28 NOTE — Telephone Encounter (Signed)
Patient dropped off patient assistance for JJ I placed in Dr. Emelda Brothers box

## 2018-03-03 ENCOUNTER — Encounter: Payer: Self-pay | Admitting: Family Medicine

## 2018-03-03 ENCOUNTER — Other Ambulatory Visit: Payer: Self-pay

## 2018-03-03 ENCOUNTER — Ambulatory Visit (INDEPENDENT_AMBULATORY_CARE_PROVIDER_SITE_OTHER): Payer: No Typology Code available for payment source | Admitting: Family Medicine

## 2018-03-03 VITALS — BP 108/64 | HR 66 | Temp 98.7°F | Wt 325.0 lb

## 2018-03-03 DIAGNOSIS — Z23 Encounter for immunization: Secondary | ICD-10-CM | POA: Diagnosis not present

## 2018-03-03 DIAGNOSIS — Z13220 Encounter for screening for lipoid disorders: Secondary | ICD-10-CM | POA: Diagnosis not present

## 2018-03-03 DIAGNOSIS — Z0000 Encounter for general adult medical examination without abnormal findings: Secondary | ICD-10-CM

## 2018-03-03 DIAGNOSIS — E119 Type 2 diabetes mellitus without complications: Secondary | ICD-10-CM | POA: Diagnosis not present

## 2018-03-03 LAB — LIPID PANEL
Cholesterol: 147 mg/dL (ref 0–200)
HDL: 31.2 mg/dL — ABNORMAL LOW (ref >39.00)
LDL Cholesterol: 95 mg/dL (ref 0–99)
NONHDL: 115.73
Total CHOL/HDL Ratio: 5
Triglycerides: 103 mg/dL (ref 0.0–149.0)
VLDL: 20.6 mg/dL (ref 0.0–40.0)

## 2018-03-03 MED ORDER — METFORMIN HCL ER 500 MG PO TB24: 500.0000 mg | ORAL_TABLET | Freq: Every day | ORAL | 1 refills | Status: DC

## 2018-03-03 NOTE — Patient Instructions (Addendum)
Congratulations improvement in diabetes.  Keep up the good work. Preventive Care 18-39 Years, Female Preventive care refers to lifestyle choices and visits with your health care provider that can promote health and wellness. What does preventive care include?  A yearly physical exam. This is also called an annual well check.  Dental exams once or twice a year.  Routine eye exams. Ask your health care provider how often you should have your eyes checked.  Personal lifestyle choices, including: ? Daily care of your teeth and gums. ? Regular physical activity. ? Eating a healthy diet. ? Avoiding tobacco and drug use. ? Limiting alcohol use. ? Practicing safe sex. ? Taking vitamin and mineral supplements as recommended by your health care provider. What happens during an annual well check? The services and screenings done by your health care provider during your annual well check will depend on your age, overall health, lifestyle risk factors, and family history of disease. Counseling Your health care provider may ask you questions about your:  Alcohol use.  Tobacco use.  Drug use.  Emotional well-being.  Home and relationship well-being.  Sexual activity.  Eating habits.  Work and work Statistician.  Method of birth control.  Menstrual cycle.  Pregnancy history.  Screening You may have the following tests or measurements:  Height, weight, and BMI.  Diabetes screening. This is done by checking your blood sugar (glucose) after you have not eaten for a while (fasting).  Blood pressure.  Lipid and cholesterol levels. These may be checked every 5 years starting at age 62.  Skin check.  Hepatitis C blood test.  Hepatitis B blood test.  Sexually transmitted disease (STD) testing.  BRCA-related cancer screening. This may be done if you have a family history of breast, ovarian, tubal, or peritoneal cancers.  Pelvic exam and Pap test. This may be done every 3  years starting at age 6. Starting at age 65, this may be done every 5 years if you have a Pap test in combination with an HPV test.  Discuss your test results, treatment options, and if necessary, the need for more tests with your health care provider. Vaccines Your health care provider may recommend certain vaccines, such as:  Influenza vaccine. This is recommended every year.  Tetanus, diphtheria, and acellular pertussis (Tdap, Td) vaccine. You may need a Td booster every 10 years.  Varicella vaccine. You may need this if you have not been vaccinated.  HPV vaccine. If you are 82 or younger, you may need three doses over 6 months.  Measles, mumps, and rubella (MMR) vaccine. You may need at least one dose of MMR. You may also need a second dose.  Pneumococcal 13-valent conjugate (PCV13) vaccine. You may need this if you have certain conditions and were not previously vaccinated.  Pneumococcal polysaccharide (PPSV23) vaccine. You may need one or two doses if you smoke cigarettes or if you have certain conditions.  Meningococcal vaccine. One dose is recommended if you are age 40-21 years and a first-year college student living in a residence hall, or if you have one of several medical conditions. You may also need additional booster doses.  Hepatitis A vaccine. You may need this if you have certain conditions or if you travel or work in places where you may be exposed to hepatitis A.  Hepatitis B vaccine. You may need this if you have certain conditions or if you travel or work in places where you may be exposed to hepatitis B.  Haemophilus  influenzae type b (Hib) vaccine. You may need this if you have certain risk factors.  Talk to your health care provider about which screenings and vaccines you need and how often you need them. This information is not intended to replace advice given to you by your health care provider. Make sure you discuss any questions you have with your health care  provider. Document Released: 07/14/2001 Document Revised: 02/05/2016 Document Reviewed: 03/19/2015 Elsevier Interactive Patient Education  2018 [employer].  Diabetes Mellitus and Exercise Exercising regularly is important for your overall health, especially when you have diabetes (diabetes mellitus). Exercising is not only about losing weight. It has many health benefits, such as increasing muscle strength and bone density and reducing body fat and stress. This leads to improved fitness, flexibility, and endurance, all of which result in better overall health. Exercise has additional benefits for people with diabetes, including:  Reducing appetite.  Helping to lower and control blood glucose.  Lowering blood pressure.  Helping to control amounts of fatty substances (lipids) in the blood, such as cholesterol and triglycerides.  Helping the body to respond better to insulin (improving insulin sensitivity).  Reducing how much insulin the body needs.  Decreasing the risk for heart disease by: ? Lowering cholesterol and triglyceride levels. ? Increasing the levels of good cholesterol. ? Lowering blood glucose levels.  What is my activity plan? Your health care provider or certified diabetes educator can help you make a plan for the type and frequency of exercise (activity plan) that works for you. Make sure that you:  Do at least 150 minutes of moderate-intensity or vigorous-intensity exercise each week. This could be brisk walking, biking, or water aerobics. ? Do stretching and strength exercises, such as yoga or weightlifting, at least 2 times a week. ? Spread out your activity over at least 3 days of the week.  Get some form of physical activity every day. ? Do not go more than 2 days in a row without some kind of physical activity. ? Avoid being inactive for more than 90 minutes at a time. Take frequent breaks to walk or stretch.  Choose a type of exercise or activity that you  enjoy, and set realistic goals.  Start slowly, and gradually increase the intensity of your exercise over time.  What do I need to know about managing my diabetes?  Check your blood glucose before and after exercising. ? If your blood glucose is higher than 240 mg/dL (13.3 mmol/L) before you exercise, check your urine for ketones. If you have ketones in your urine, do not exercise until your blood glucose returns to normal.  Know the symptoms of low blood glucose (hypoglycemia) and how to treat it. Your risk for hypoglycemia increases during and after exercise. Common symptoms of hypoglycemia can include: ? Hunger. ? Anxiety. ? Sweating and feeling clammy. ? Confusion. ? Dizziness or feeling light-headed. ? Increased heart rate or palpitations. ? Blurry vision. ? Tingling or numbness around the mouth, lips, or tongue. ? Tremors or shakes. ? Irritability.  Keep a rapid-acting carbohydrate snack available before, during, and after exercise to help prevent or treat hypoglycemia.  Avoid injecting insulin into areas of the body that are going to be exercised. For example, avoid injecting insulin into: ? The arms, when playing tennis. ? The legs, when jogging.  Keep records of your exercise habits. Doing this can help you and your health care provider adjust your diabetes management plan as needed. Write down: ? Food that  you eat before and after you exercise. ? Blood glucose levels before and after you exercise. ? The type and amount of exercise you have done. ? When your insulin is expected to peak, if you use insulin. Avoid exercising at times when your insulin is peaking.  When you start a new exercise or activity, work with your health care provider to make sure the activity is safe for you, and to adjust your insulin, medicines, or food intake as needed.  Drink plenty of water while you exercise to prevent dehydration or heat stroke. Drink enough fluid to keep your urine clear or  pale yellow. This information is not intended to replace advice given to you by your health care provider. Make sure you discuss any questions you have with your health care provider. Document Released: 08/08/2003 Document Revised: 12/06/2015 Document Reviewed: 10/28/2015 Elsevier Interactive Patient Education  2018 [employer].

## 2018-03-03 NOTE — Progress Notes (Signed)
Subjective:     Miranda Stevens is a 37 y.o. female and is here for a comprehensive physical exam. The patient reports no problems.  Since last OFV, pt graduated from school.  She starts a new job in a few wks.  Pt has started eating better, seeing a dietician, and Endocrinologist, Dr. Lucianne Muss.  Pt's hgb A1C has drastically improved.  She never started insulin.  Taking Invokana 100 mg, metformin XR 500 mg.  Social History   Socioeconomic History  . Marital status: Single    Spouse name: Not on file  . Number of children: Not on file  . Years of education: Not on file  . Highest education level: Not on file  Occupational History  . Not on file  Social Needs  . Financial resource strain: Not on file  . Food insecurity:    Worry: Not on file    Inability: Not on file  . Transportation needs:    Medical: Not on file    Non-medical: Not on file  Tobacco Use  . Smoking status: Former Smoker    Years: 10.00    Types: Cigarettes    Last attempt to quit: 01/31/2011    Years since quitting: 7.0  . Smokeless tobacco: Never Used  Substance and Sexual Activity  . Alcohol use: Not Currently  . Drug use: Not Currently    Comment: laswt used 1 week ago  . Sexual activity: Not Currently  Lifestyle  . Physical activity:    Days per week: Not on file    Minutes per session: Not on file  . Stress: Not on file  Relationships  . Social connections:    Talks on phone: Not on file    Gets together: Not on file    Attends religious service: Not on file    Active member of club or organization: Not on file    Attends meetings of clubs or organizations: Not on file    Relationship status: Not on file  . Intimate partner violence:    Fear of current or ex partner: Not on file    Emotionally abused: Not on file    Physically abused: Not on file    Forced sexual activity: Not on file  Other Topics Concern  . Not on file  Social History Narrative  . Not on file   Health Maintenance   Topic Date Due  . PNEUMOCOCCAL POLYSACCHARIDE VACCINE AGE 41-64 HIGH RISK  07/04/1983  . OPHTHALMOLOGY EXAM  07/04/1991  . URINE MICROALBUMIN  07/04/1991  . TETANUS/TDAP  07/03/2000  . INFLUENZA VACCINE  12/30/2017  . HEMOGLOBIN A1C  08/25/2018  . FOOT EXAM  02/25/2019  . PAP SMEAR  11/02/2020  . HIV Screening  Completed    The following portions of the patient's history were reviewed and updated as appropriate: allergies, current medications, past family history, past medical history, past social history, past surgical history and problem list.  Review of Systems A comprehensive review of systems was negative.   Objective:    BP 108/64 (BP Location: Left Arm, Patient Position: Sitting, Cuff Size: Large)   Pulse 66   Temp 98.7 F (37.1 C) (Oral)   Wt (!) 325 lb (147.4 kg)   SpO2 98%   BMI 45.33 kg/m  General appearance: alert, cooperative, appears stated age and no distress Head: Normocephalic, without obvious abnormality, atraumatic Eyes: conjunctivae/corneas clear. PERRL, EOM's intact. Fundi benign. Ears: normal TM's and external ear canals both ears Nose: Nares normal. Septum  midline. Mucosa normal. No drainage or sinus tenderness. Throat: lips, mucosa, and tongue normal; teeth and gums normal Neck: no adenopathy, no carotid bruit, no JVD, supple, symmetrical, trachea midline and thyroid not enlarged, symmetric, no tenderness/mass/nodules Lungs: clear to auscultation bilaterally Heart: regular rate and rhythm, S1, S2 normal, no murmur, click, rub or gallop Abdomen: soft, non-tender; bowel sounds normal; no masses,  no organomegaly Extremities: extremities normal, atraumatic, no cyanosis or edema Skin: Skin color, texture, turgor normal. No rashes or lesions Neurologic: Alert and oriented X 3, normal strength and tone. Normal symmetric reflexes. Normal coordination and gait    Assessment:    Healthy female exam.      Plan:     Anticipatory guidance given including  wearing seatbelts, smoke detectors in the home, increasing physical activity, increasing p.o. intake of water and vegetables. -pap up to date 11/02/2017 -will obtain lipids -given handout See After Visit Summary for Counseling Recommendations    Type 2 DM -controlled -continue invokana 100 mg and metformin xr 500 mg  -continue lifestyle modifications -last Hgb A1C 02/24/18 7.4% -continue f/u with Endocrinology, Dr. Lucianne Muss  Need for influenza vaccine -influenza vaccine given.  F/u prn  Abbe Amsterdam, MD

## 2018-03-04 ENCOUNTER — Telehealth: Payer: Self-pay | Admitting: Endocrinology

## 2018-03-04 NOTE — Telephone Encounter (Signed)
Miranda Stevens from [facility]  Patient assistant LVM--need Dr. Lucianne Muss NPI number. Re-faxed application with Dr. Lucianne Muss NPI number.

## 2018-03-08 ENCOUNTER — Encounter: Payer: Self-pay | Admitting: Family Medicine

## 2018-03-29 ENCOUNTER — Telehealth: Payer: Self-pay

## 2018-03-29 NOTE — Telephone Encounter (Signed)
Pt called and stated that she thinks that she has a UTI can she can get someone to call her.  LM for pt that I am returning her call to please give the office a call if she continues to have any questions, comments, or concerns.  Per chart review, pt has two appts at other offices for the same c/o.

## 2018-03-30 ENCOUNTER — Encounter: Payer: Self-pay | Admitting: Family Medicine

## 2018-03-30 ENCOUNTER — Ambulatory Visit (INDEPENDENT_AMBULATORY_CARE_PROVIDER_SITE_OTHER): Payer: No Typology Code available for payment source | Admitting: Family Medicine

## 2018-03-30 VITALS — BP 102/82 | HR 78 | Temp 98.1°F | Wt 326.0 lb

## 2018-03-30 DIAGNOSIS — R350 Frequency of micturition: Secondary | ICD-10-CM | POA: Diagnosis not present

## 2018-03-30 LAB — POC URINALSYSI DIPSTICK (AUTOMATED)
Bilirubin, UA: NEGATIVE
GLUCOSE UA: POSITIVE — AB
Ketones, UA: NEGATIVE
Leukocytes, UA: NEGATIVE
NITRITE UA: NEGATIVE
PH UA: 6 (ref 5.0–8.0)
Protein, UA: NEGATIVE
Spec Grav, UA: 1.02 (ref 1.010–1.025)
UROBILINOGEN UA: 0.2 U/dL

## 2018-03-30 MED ORDER — FLUCONAZOLE 150 MG PO TABS: 150.0000 mg | ORAL_TABLET | Freq: Once | ORAL | 0 refills | Status: AC

## 2018-03-30 MED ORDER — FLUCONAZOLE 150 MG PO TABS: 150.0000 mg | ORAL_TABLET | Freq: Once | ORAL | 0 refills | Status: DC

## 2018-03-30 NOTE — Patient Instructions (Signed)
Urinary Frequency, Adult Urinary frequency means urinating more often than usual. People with urinary frequency urinate at least 8 times in 24 hours, even if they drink a normal amount of fluid. Although they urinate more often than normal, the total amount of urine produced in a day may be normal. Urinary frequency is also called pollakiuria. What are the causes? This condition may be caused by:  A urinary tract infection.  Obesity.  Bladder problems, such as bladder stones.  Caffeine or alcohol.  Eating food or drinking fluids that irritate the bladder. These include coffee, tea, soda, artificial sweeteners, citrus, tomato-based foods, and chocolate.  Certain medicines, such as medicines that help the body get rid of extra fluid (diuretics).  Muscle or nerve weakness.  Overactive bladder.  Chronic diabetes.  Interstitial cystitis.  In men, problems with the prostate, such as an enlarged prostate.  In women, pregnancy.  In some cases, the cause may not be known. What increases the risk? This condition is more likely to develop in:  Women who have gone through menopause.  Men with prostate problems.  People with a disease or injury that affects the nerves or spinal cord.  People who have or have had a condition that affects the brain, such as a stroke.  What are the signs or symptoms? Symptoms of this condition include:  Feeling an urgent need to urinate often. The stress and anxiety of needing to find a bathroom quickly can make this urge worse.  Urinating 8 or more times in 24 hours.  Urinating as often as every 1 to 2 hours.  How is this diagnosed? This condition is diagnosed based on your symptoms, your medical history, and a physical exam. You may have tests, such as:  Blood tests.  Urine tests.  Imaging tests, such as X-rays or ultrasounds.  A bladder test.  A test of your neurological system. This is the body system that senses the need to  urinate.  A test to check for problems in the urethra and bladder called cystoscopy.  You may also be asked to keep a bladder diary. A bladder diary is a record of what you eat and drink, how often you urinate, and how much you urinate. You may need to see a health care provider who specializes in conditions of the urinary tract (urologist) or kidneys (nephrologist). How is this treated? Treatment for this condition depends on the cause. Sometimes the condition goes away on its own and treatment is not necessary. If treatment is needed, it may include:  Taking medicine.  Learning exercises that strengthen the muscles that help control urination.  Following a bladder training program. This may include: ? Learning to delay going to the bathroom. ? Double urinating (voiding). This helps if you are not completely emptying your bladder. ? Scheduled voiding.  Making diet changes, such as: ? Avoiding caffeine. ? Drinking fewer fluids, especially alcohol. ? Not drinking in the evening. ? Not having foods or drinks that may irritate the bladder. ? Eating foods that help prevent or ease constipation. Constipation can make this condition worse.  Having the nerves in your bladder stimulated. There are two options for stimulating the nerves to your bladder: ? Outpatient electrical nerve stimulation. This is done by your health care provider. ? Surgery to implant a bladder pacemaker. The pacemaker helps to control the urge to urinate.  Follow these instructions at home:  Keep a bladder diary if told to by your health care provider.  Take over-the-counter   and prescription medicines only as told by your health care provider.  Do any exercises as told by your health care provider.  Follow a bladder training program as told by your health care provider.  Make any recommended diet changes.  Keep all follow-up visits as told by your health care provider. This is important. Contact a health care  provider if:  You start urinating more often.  You feel pain or irritation when you urinate.  You notice blood in your urine.  Your urine looks cloudy.  You develop a fever.  You begin vomiting. Get help right away if:  You are unable to urinate. This information is not intended to replace advice given to you by your health care provider. Make sure you discuss any questions you have with your health care provider. Document Released: 03/14/2009 Document Revised: 06/19/2015 Document Reviewed: 12/12/2014 Elsevier Interactive Patient Education  2018 [employer]. Canagliflozin oral tablets What is this medicine? CANAGLIFLOZIN (KAN a gli FLOE zin) helps to treat type 2 diabetes. It helps to control blood sugar. Treatment is combined with diet and exercise. This medicine may be used for other purposes; ask your health care provider or pharmacist if you have questions. COMMON BRAND NAME(S): Invokana What should I tell my health care provider before I take this medicine? They need to know if you have any of these conditions: -artery disease -dehydration -diabetic ketoacidosis -diet low in salt -eating less due to illness, surgery, dieting, or any other reason -foot sores -having surgery -high cholesterol -high levels of potassium in the blood -history of amputation -history of pancreatitis or pancreas problems -history of yeast infection of the penis or vagina -if you often drink alcohol -infections in the bladder, kidneys, or urinary tract -kidney disease -liver disease -low blood pressure -nerve damage -on hemodialysis -problems urinating -type 1 diabetes -uncircumcised female -an unusual or allergic reaction to canagliflozin, other medicines, foods, dyes, or preservatives -pregnant or trying to get pregnant -breast-feeding How should I use this medicine? Take this medicine by mouth with a glass of water. Follow the directions on the prescription label. Take it before the  first meal of the day. Take your dose at the same time each day. Do not take more often than directed. Do not stop taking except on your doctor's advice. A special MedGuide will be given to you by the pharmacist with each prescription and refill. Be sure to read this information carefully each time. Talk to your pediatrician regarding the use of this medicine in children. Special care may be needed. Overdosage: If you think you have taken too much of this medicine contact a poison control center or emergency room at once. NOTE: This medicine is only for you. Do not share this medicine with others. What if I miss a dose? If you miss a dose, take it as soon as you can. If it is almost time for your next dose, take only that dose. Do not take double or extra doses. What may interact with this medicine? Do not take this medicine with any of the following medications: -gatifloxacin This medicine may also interact with the following medications: -alcohol -certain medicines for blood pressure, heart disease -digoxin -diuretics -insulin -nateglinide -phenobarbital -phenytoin -repaglinide -rifampin -ritonavir -sulfonylureas like glimepiride, glipizide, glyburide This list may not describe all possible interactions. Give your health care provider a list of all the medicines, herbs, non-prescription drugs, or dietary supplements you use. Also tell them if you smoke, drink alcohol, or use illegal drugs. Some  items may interact with your medicine. What should I watch for while using this medicine? Visit your doctor or health care professional for regular checks on your progress. This medicine can cause a serious condition in which there is too much acid in the blood. If you develop nausea, vomiting, stomach pain, unusual tiredness, or breathing problems, stop taking this medicine and call your doctor right away. If possible, use a ketone dipstick to check for ketones in your urine. A test called the  HbA1C (A1C) will be monitored. This is a simple blood test. It measures your blood sugar control over the last 2 to 3 months. You will receive this test every 3 to 6 months. Learn how to check your blood sugar. Learn the symptoms of low and high blood sugar and how to manage them. Always carry a quick-source of sugar with you in case you have symptoms of low blood sugar. Examples include hard sugar candy or glucose tablets. Make sure others know that you can choke if you eat or drink when you develop serious symptoms of low blood sugar, such as seizures or unconsciousness. They must get medical help at once. Tell your doctor or health care professional if you have high blood sugar. You might need to change the dose of your medicine. If you are sick or exercising more than usual, you might need to change the dose of your medicine. Do not skip meals. Ask your doctor or health care professional if you should avoid alcohol. Many nonprescription cough and cold products contain sugar or alcohol. These can affect blood sugar. Wear a medical ID bracelet or chain, and carry a card that describes your disease and details of your medicine and dosage times. What side effects may I notice from receiving this medicine? Side effects that you should report to your doctor or health care professional as soon as possible: -allergic reactions like skin rash, itching or hives, swelling of the face, lips, or tongue -breathing problems -chest pain -dizziness -fast or irregular heartbeat -feeling faint or lightheaded, falls -muscle weakness -nausea, vomiting, unusual stomach upset or pain -new pain or tenderness, change in skin color, sores or ulcers, or infection in legs or feet -signs and symptoms of low blood sugar such as feeling anxious, confusion, dizziness, increased hunger, unusually weak or tired, sweating, shakiness, cold, irritable, headache, blurred vision, fast heartbeat, loss of consciousness -signs and  symptoms of a urinary tract infection, such as fever, chills, a burning feeling when urinating, blood in the urine, back pain -trouble passing urine or change in the amount of urine, including an urgent need to urinate more often, in larger amounts, or at night -penile discharge, itching, or pain in men -unusual tiredness -vaginal discharge, itching, or odor in women Side effects that usually do not require medical attention (report to your doctor or health care professional if they continue or are bothersome): -constipation -mild increase in urination -thirsty This list may not describe all possible side effects. Call your doctor for medical advice about side effects. You may report side effects to FDA at 1-800-FDA-1088. Where should I keep my medicine? Keep out of the reach of children. Store at room temperature between 20 and 25 degrees C (68 and 77 degrees F). Throw away any unused medicine after the expiration date. NOTE: This sheet is a summary. It may not cover all possible information. If you have questions about this medicine, talk to your doctor, pharmacist, or health care provider.  2018 Elsevier/Gold Standard (2015-10-15 14:17:20)

## 2018-03-30 NOTE — Progress Notes (Signed)
Subjective:    Patient ID: Miranda Stevens, female    DOB: Feb 22, 1981, 37 y.o.   MRN: [id number]  No chief complaint on file.   HPI Patient was seen today for acute concern. Pt endorses urinary frequency, nocturia, mild dysuria and irritation.  Pt urinating q 30 mins.  Pt has a h/o DM, on invokanna 100 mg, glimeperide 4 mg, and metformin XR 500 mg daily.  Pt denies increased hunger or thirst, no vaginal symptoms.  Last hgb A1C was 7.4% on 02/24/18.  Past Medical History:  Diagnosis Date  . Asthma   . Diabetes mellitus without complication (HCC)   . Headache(784.0)   . High cholesterol   . Migraine   . Migraine   . Pericarditis   . Polycystic disease, ovaries     Allergies  Allergen Reactions  . Aspirin Other (See Comments)    NO aspirin    ROS General: Denies fever, chills, night sweats, changes in weight, changes in appetite HEENT: Denies headaches, ear pain, changes in vision, rhinorrhea, sore throat CV: Denies CP, palpitations, SOB, orthopnea Pulm: Denies SOB, cough, wheezing GI: Denies abdominal pain, nausea, vomiting, diarrhea, constipation GU: Denies hematuria, vaginal discharge  + dysuria, frequency, irritation Msk: Denies muscle cramps, joint pains Neuro: Denies weakness, numbness, tingling Skin: Denies rashes, bruising Psych: Denies depression, anxiety, hallucinations    Objective:    Blood pressure 102/82, pulse 78, temperature 98.1 F (36.7 C), temperature source Oral, weight (!) 326 lb (147.9 kg), SpO2 98 %.  Gen. Pleasant, well-nourished, in no distress, normal affect   Lungs: no accessory muscle use Cardiovascular: RRR, no peripheral edema Neuro:  A&Ox3, CN II-XII intact, normal gait  Wt Readings from Last 3 Encounters:  03/30/18 (!) 326 lb (147.9 kg)  03/03/18 (!) 325 lb (147.4 kg)  02/24/18 (!) 324 lb (147 kg)    Lab Results  Component Value Date   WBC 9.7 09/14/2017   HGB 14.4 09/14/2017   HCT 44.8 09/14/2017   PLT 208 09/14/2017   GLUCOSE 145 (H) 02/24/2018   CHOL 147 03/03/2018   TRIG 103.0 03/03/2018   HDL 31.20 (L) 03/03/2018   LDLCALC 95 03/03/2018   ALT 19 03/14/2012   AST 14 03/14/2012   NA 137 02/24/2018   K 4.0 02/24/2018   CL 103 02/24/2018   CREATININE 0.95 02/24/2018   BUN 13 02/24/2018   CO2 25 02/24/2018   HGBA1C 7.4 (A) 02/24/2018    Assessment/Stevens:  Urinary frequency  -initial Urine sample spilled, then pt only able to give minimal sample after hydration with water. -UA with 3+ glucose, SG 1.020, and + RBCs -discussed symptoms likely caused by invokana as med is getting rid of extra glucose through urination -discussed concentrated urine can also cause irritation. -discussed possibility of developing a yeast infection d/t the medication.  Will send in rx for Diflucan -pt to discuss med with her Endocrinologist at next [facility] in a wk. - Stevens: POCT Urinalysis Dipstick (Automated)  F/u prn  Abbe Amsterdam, MD

## 2018-04-05 ENCOUNTER — Encounter: Payer: Self-pay | Admitting: Endocrinology

## 2018-04-05 ENCOUNTER — Telehealth: Payer: Self-pay | Admitting: Family Medicine

## 2018-04-05 ENCOUNTER — Ambulatory Visit (INDEPENDENT_AMBULATORY_CARE_PROVIDER_SITE_OTHER): Payer: No Typology Code available for payment source | Admitting: Endocrinology

## 2018-04-05 VITALS — BP 106/78 | HR 76 | Ht 71.0 in | Wt 321.0 lb

## 2018-04-05 DIAGNOSIS — E1165 Type 2 diabetes mellitus with hyperglycemia: Secondary | ICD-10-CM | POA: Diagnosis not present

## 2018-04-05 NOTE — Progress Notes (Signed)
Patient ID: Kingsley Plan, female   DOB: 1980-09-10, 37 y.o.   MRN: [id number]           Reason for Appointment: Follow-up for Type 2 Diabetes  Referring physician: Abbe Amsterdam   History of Present Illness:          Date of diagnosis of type 2 diabetes mellitus: 2013       Background history:  She was diagnosed to have diabetes as part of her evaluation for PCOS with a glucose tolerance test She was then put on metformin which she has continued Most of her diabetes care has been in [place] and no records are available Because of her sugars going up last year Amaryl was added to her and metformin continued unchanged Not clear what her A1c was at that time  Recent history:   Most recent A1c is 7.4 compared to 12.6 in June  INSULIN regimen is: none      Non-insulin hypoglycemic drugs the patient is taking are: Metformin ER 2g, Amaryl 2 mg in the morning, Invokana 100 mg daily  Current management, blood sugar patterns and problems identified:  She was started on Invokana on her initial consultation since her sugars had appeared to be improved with better diet  However she does not appear to have any insurance coverage for her Invokana and has not taken this for at least 2 days now  She is also taking this irregularly because it tends to cause increased urination and she cannot take it consistently in the morning and sometimes may take it in the evening  She did not bring any records or her meter for review and has not checked her sugar in 1 week  She claims her blood sugar is about 147 but likely checking only infrequently and mostly in the morning because of cost  She was previously eating only lunch and dinner and now trying to eat 3 meals  Even with using Invokana her weight has improved only 3 pounds  Currently not finding the time to exercise and she complains of feeling tired  No symptoms of increased thirst usually        Side effects from medications have  been: Diarrhea from regular metformin  Compliance with the medical regimen: Improving             Exercise:  Walking infrequently now   Glucose monitoring:  done less than 1 times a day         Glucometer:  [employer] brand       Blood Glucose readings as above   Dietician visit, most recent: 02/24/2018  Weight history:  Wt Readings from Last 3 Encounters:  04/05/18 (!) 321 lb (145.6 kg)  03/30/18 (!) 326 lb (147.9 kg)  03/03/18 (!) 325 lb (147.4 kg)    Glycemic control:   Lab Results  Component Value Date   HGBA1C 7.4 (A) 02/24/2018   HGBA1C 12.6 (A) 11/01/2017   Lab Results  Component Value Date   LDLCALC 95 03/03/2018   CREATININE 0.95 02/24/2018   No results found for: MICRALBCREAT  No results found for: FRUCTOSAMINE    Allergies as of 04/05/2018      Reactions   Aspirin Other (See Comments)   NO aspirin      Medication List        Accurate as of 04/05/18  4:03 PM. Always use your most recent med list.          acetaminophen 500 MG tablet Commonly  known as:  TYLENOL Take 1,000 mg by mouth every 6 (six) hours as needed. Pain   albuterol 108 (90 Base) MCG/ACT inhaler Commonly known as:  PROVENTIL HFA;VENTOLIN HFA Inhale 1-2 puffs into the lungs every 6 (six) hours as needed for wheezing or shortness of breath.   atorvastatin 10 MG tablet Commonly known as:  LIPITOR Take 1 tablet (10 mg total) by mouth daily.   canagliflozin 100 MG Tabs tablet Commonly known as:  INVOKANA 1 tablet before breakfast   CINNAMON PO Take by mouth.   glimepiride 4 MG tablet Commonly known as:  AMARYL Take 4 mg by mouth daily with breakfast.   medroxyPROGESTERone 150 MG/ML injection Commonly known as:  DEPO-PROVERA Inject 1 mL (150 mg total) into the muscle every 3 (three) months.   metFORMIN 500 MG 24 hr tablet Commonly known as:  GLUCOPHAGE-XR Take 1 tablet (500 mg total) by mouth daily with breakfast.   Pen Needles 32G X 6 MM Misc 1 each by Subcutaneous  Infusion route as directed.   rizatriptan 10 MG tablet Commonly known as:  MAXALT Take 10 mg by mouth as needed for migraine. May repeat in 2 hours if needed   triamcinolone cream 0.1 % Commonly known as:  KENALOG Apply topically 2 (two) times daily. Apply to affected area       Allergies:  Allergies  Allergen Reactions  . Aspirin Other (See Comments)    NO aspirin    Past Medical History:  Diagnosis Date  . Asthma   . Diabetes mellitus without complication (HCC)   . Headache(784.0)   . High cholesterol   . Migraine   . Migraine   . Pericarditis   . Polycystic disease, ovaries     Past Surgical History:  Procedure Laterality Date  . BIOPSY ENDOMETRIAL      Family History  Problem Relation Age of Onset  . Lung disease Mother   . Diabetes Mother   . Heart disease Mother   . Hyperlipidemia Father     Social History:  reports that she quit smoking about 7 years ago. Her smoking use included cigarettes. She quit after 10.00 years of use. She has never used smokeless tobacco. She reports that she drank alcohol. She reports that she has current or past drug history.   Review of Systems   Lipid history: On treatment with Lipitor for about a year, baseline lipids not available    Lab Results  Component Value Date   CHOL 147 03/03/2018   HDL 31.20 (L) 03/03/2018   LDLCALC 95 03/03/2018   TRIG 103.0 03/03/2018   CHOLHDL 5 03/03/2018           Hypertension: Has not been present  BP Readings from Last 3 Encounters:  04/05/18 106/78  03/30/18 102/82  03/03/18 108/64    Most recent eye exam was in 2019  Most recent foot exam: 9/19  Currently known complications of diabetes: None  LABS:  Office Visit on 03/30/2018  Component Date Value Ref Range Status  . Color, UA 03/30/2018 yellow   Final  . Clarity, UA 03/30/2018 clear   Final  . Glucose, UA 03/30/2018 Positive* Negative Final  . Bilirubin, UA 03/30/2018 neg   Final  . Ketones, UA 03/30/2018 neg    Final  . Spec Grav, UA 03/30/2018 1.020  1.010 - 1.025 Final  . Blood, UA 03/30/2018 +   Final  . pH, UA 03/30/2018 6.0  5.0 - 8.0 Final  . Protein, UA 03/30/2018  Negative  Negative Final  . Urobilinogen, UA 03/30/2018 0.2  0.2 or 1.0 E.U./dL Final  . Nitrite, UA 29/56/2130 neg   Final  . Leukocytes, UA 03/30/2018 Negative  Negative Final    Physical Examination:  BP 106/78   Pulse 76   Ht 5\' 11"  (1.803 m)   Wt (!) 321 lb (145.6 kg)   SpO2 99%   BMI 44.77 kg/m   No pedal edema  ASSESSMENT:  Diabetes type 2 with morbid obesity  See history of present illness for detailed discussion of current diabetes management, blood sugar patterns and problems identified  Her A1c recently was 7.4 However her fasting glucose is 337 today for no apparent reason Although she may have responded somewhat to Invokana in addition to her metformin and Amaryl it appears that she is insulin deficient currently Discussed that she needs to be on insulin to improve her blood sugars more reliably and also she cannot afford Invokana at this time This will also remove glucose toxicity to some extent  PLAN:    Start 14 units Lantus daily  She will try to get her insulin supplies from the PCP where she had been approved for the patient assistance program  She will start with NovoLog 6 units before breakfast and lunch and 10 units with her dinner or the main meal  She will need to check her sugars 3-4 times a day and keep a record on the logbook given  She will be instructed by nurse educator  Needs to start walking for exercise  Discussed that if she is able to get coverage for Invokana she may start that in addition to insulin and may require less insulin subsequently  Patient Instructions   Lantus insulin: This insulin provides blood sugar control for up to 24 hours.    Start with 14 units at bedtime daily and increase by 2 units every 3 days until the waking up sugars are under 130. Then  continue the same dose.  If blood sugar is under 90 for 2 days in a row, reduce the dose by 2 units.   Note that this insulin does not control the rise of blood sugar with meals    Novolog 6 units before small meals and 10 before main meal  Check blood sugars on waking up 7 days a week  Also check blood sugars about 2 hours after meals and do this after different meals by rotation  Recommended blood sugar levels on waking up are 90-130 and about 2 hours after meal is 130-160  Please bring your blood sugar monitor to each visit, thank you  Amaryl 1/2 twice daily       Reather Littler 04/05/2018, 4:03 PM   Note: This office note was prepared with Dragon voice recognition system technology. Any transcriptional errors that result from this process are unintentional.

## 2018-04-05 NOTE — Telephone Encounter (Signed)
Patient previously was getting insulin sent here for the assistance program.  She didn't have to pick it up due to not having to take it, however her sugar has gone back up.  Dr Lucianne Muss is putting her back on insulin.  She needs to get the insulin on the assistance program again.  Please give the patient a call for assistance with this.   Phone:  [phone]

## 2018-04-05 NOTE — Patient Instructions (Addendum)
Lantus insulin: This insulin provides blood sugar control for up to 24 hours.    Start with 14 units at bedtime daily and increase by 2 units every 3 days until the waking up sugars are under 130. Then continue the same dose.  If blood sugar is under 90 for 2 days in a row, reduce the dose by 2 units.   Note that this insulin does not control the rise of blood sugar with meals    Novolog 6 units before small meals and 10 before main meal  Check blood sugars on waking up 7 days a week  Also check blood sugars about 2 hours after meals and do this after different meals by rotation  Recommended blood sugar levels on waking up are 90-130 and about 2 hours after meal is 130-160  Please bring your blood sugar monitor to each visit, thank you  Amaryl 1/2 twice daily

## 2018-04-06 ENCOUNTER — Encounter: Payer: No Typology Code available for payment source | Attending: Family Medicine | Admitting: Nutrition

## 2018-04-06 ENCOUNTER — Encounter: Payer: Self-pay | Admitting: Endocrinology

## 2018-04-06 DIAGNOSIS — E1165 Type 2 diabetes mellitus with hyperglycemia: Secondary | ICD-10-CM | POA: Insufficient documentation

## 2018-04-06 DIAGNOSIS — IMO0001 Reserved for inherently not codable concepts without codable children: Secondary | ICD-10-CM

## 2018-04-06 DIAGNOSIS — Z713 Dietary counseling and surveillance: Secondary | ICD-10-CM | POA: Diagnosis present

## 2018-04-07 ENCOUNTER — Telehealth: Payer: Self-pay | Admitting: Endocrinology

## 2018-04-07 NOTE — Telephone Encounter (Signed)
Spoke with pt voiced understanding that her insulin was put on hold since the Endocrinology stopped it. Advised pt that I  will contact the company and have them ship more and she can pick it up from the office.

## 2018-04-07 NOTE — Progress Notes (Signed)
We discussed how/why she needs the insulin, the timing of each insulin, and how to take the insulin.  She was given a lantus pen, and Novolog pen and she redemonstrated how to attach the needle, and dial in the dose.  We also discussed how to adjust the doses of each insulin, and she reported good understanding of this. She was also given some nano pen needles. We also discussed low blood sugars--symptoms, and treatment and she reported good understanding of this. Written instructions were given for the iinsulin doses and the adjustment scheduled.  She had no final questions.  Questions were answered for some diet issues.  Discussed the need to make sure meals were balanced.  She reported good understanding of this.

## 2018-04-07 NOTE — Telephone Encounter (Signed)
Patient has dropped off paper work for Dr. Lucianne Muss to fill out for her work. Patient stated she will have to step into another room to inject her insulin and she will also have to use the bathroom a lot. Paper work is in box up front.

## 2018-04-07 NOTE — Patient Instructions (Addendum)
Take 14 u of Lantus insulin every night.  Increase the dose after 3 days by 2u if the morning blood sugars are over 130. Take 6 units of insulin 10 min. before smaller meals, and 10u  10 min. before bigger meals. Test blood sugars before all meals and at bedtime. Call if questions.

## 2018-04-08 NOTE — Telephone Encounter (Signed)
Please bring all paperwork back to my inbox

## 2018-04-08 NOTE — Telephone Encounter (Signed)
Please advise 

## 2018-04-11 NOTE — Telephone Encounter (Signed)
Spoke with [employer] state that they will fax a recerification for pt to complete and fax it back for pt to receive her Insulin.

## 2018-04-14 NOTE — Telephone Encounter (Signed)
Forms from [employer] have been received and awaiting completion

## 2018-04-18 ENCOUNTER — Telehealth: Payer: Self-pay | Admitting: Endocrinology

## 2018-04-18 NOTE — Telephone Encounter (Signed)
Patient calling to check status of ADA accommadations paperwork.  Per Patient her job is requiring paperwork ASAP,  She asked for a return call regarding status and ability to pickup completed paperwork.  Call back number [phone], please leave detailed message on voicemail

## 2018-04-19 ENCOUNTER — Encounter: Payer: Self-pay | Admitting: Family Medicine

## 2018-04-19 ENCOUNTER — Ambulatory Visit (INDEPENDENT_AMBULATORY_CARE_PROVIDER_SITE_OTHER): Payer: No Typology Code available for payment source | Admitting: *Deleted

## 2018-04-19 VITALS — BP 126/71 | HR 86 | Ht 71.0 in | Wt 323.2 lb

## 2018-04-19 DIAGNOSIS — Z3042 Encounter for surveillance of injectable contraceptive: Secondary | ICD-10-CM | POA: Diagnosis not present

## 2018-04-19 MED ORDER — MEDROXYPROGESTERONE ACETATE 150 MG/ML IM SUSP
150.0000 mg | Freq: Once | INTRAMUSCULAR | Status: AC
  Administered 2018-04-19: 150 mg via INTRAMUSCULAR

## 2018-04-19 NOTE — Telephone Encounter (Signed)
Notified pt paper is sign and faxed on the 04-15-18. Pt stated that her employer received this morning. Pt will pick up a copy of her paper work at the front desk .

## 2018-04-19 NOTE — Progress Notes (Signed)
Miranda Charlynn GrimesYvonne Stevens here for Depo-Provera  Injection.  Injection administered without complication. Patient will return in 3 months for next injection.  Pt brought her own medication in.  Osvaldo Humanshley P Milan Perkins, RN 04/19/2018  2:54 PM

## 2018-04-21 NOTE — Progress Notes (Signed)
Chart reviewed for nurse visit. Agree with plan of care.   Tristen Luce Lorraine, CNM 04/21/2018 4:07 PM   

## 2018-04-22 ENCOUNTER — Other Ambulatory Visit (INDEPENDENT_AMBULATORY_CARE_PROVIDER_SITE_OTHER): Payer: No Typology Code available for payment source

## 2018-04-22 ENCOUNTER — Encounter: Payer: Self-pay | Admitting: Endocrinology

## 2018-04-22 DIAGNOSIS — E1165 Type 2 diabetes mellitus with hyperglycemia: Secondary | ICD-10-CM | POA: Diagnosis not present

## 2018-04-22 DIAGNOSIS — Z794 Long term (current) use of insulin: Secondary | ICD-10-CM

## 2018-04-22 LAB — BASIC METABOLIC PANEL
BUN: 7 mg/dL (ref 6–23)
CALCIUM: 9.1 mg/dL (ref 8.4–10.5)
CO2: 27 meq/L (ref 19–32)
Chloride: 106 mEq/L (ref 96–112)
Creatinine, Ser: 0.87 mg/dL (ref 0.40–1.20)
GFR: 94.32 mL/min (ref >60.00)
Glucose, Bld: 238 mg/dL — ABNORMAL HIGH (ref 70–99)
Potassium: 3.7 mEq/L (ref 3.5–5.1)
Sodium: 140 mEq/L (ref 135–145)

## 2018-04-23 LAB — FRUCTOSAMINE: Fructosamine: 379 umol/L — ABNORMAL HIGH (ref 0–285)

## 2018-04-27 ENCOUNTER — Telehealth: Payer: Self-pay | Admitting: Endocrinology

## 2018-04-27 ENCOUNTER — Ambulatory Visit (INDEPENDENT_AMBULATORY_CARE_PROVIDER_SITE_OTHER): Payer: No Typology Code available for payment source | Admitting: Endocrinology

## 2018-04-27 ENCOUNTER — Encounter: Payer: Self-pay | Admitting: Endocrinology

## 2018-04-27 VITALS — BP 108/70 | HR 84 | Ht 71.0 in | Wt 326.2 lb

## 2018-04-27 DIAGNOSIS — E1165 Type 2 diabetes mellitus with hyperglycemia: Secondary | ICD-10-CM

## 2018-04-27 NOTE — Telephone Encounter (Signed)
Noted  

## 2018-04-27 NOTE — Telephone Encounter (Signed)
Pt is treated by Endo for DM management.   Copied from CRM [phone]. Topic: General - Other >> Apr 27, 2018 10:46 AM Jaquita Rectoravis, Karen A wrote: Reason for CRM: Patient called to say that her insulin glargine (LANTUS) 100 UNIT/ML injection was returned back to the Lantus company and she need it now. Want to go back to receiving that medication. Also states that Dr Lucianne MussKumar recommend that she is put on Apidra which also comes from the Lantus company. Asking for a call back to inform how to go back to getting back on this program since she has no insurance coverage. Ph# [phone]

## 2018-04-27 NOTE — Progress Notes (Signed)
Patient ID: Miranda Stevens, female   DOB: 12-Feb-1981, 37 y.o.   MRN: [id number]           Reason for Appointment: Follow-up for Type 2 Diabetes  Referring physician: Abbe Amsterdam   History of Present Illness:          Date of diagnosis of type 2 diabetes mellitus: 2013       Background history:  She was diagnosed to have diabetes as part of her evaluation for PCOS with a glucose tolerance test She was then put on metformin which she has continued Most of her diabetes care has been in [place] and no records are available Because of her sugars going up last year Amaryl was added to her and metformin continued unchanged Not clear what her A1c was at that time  Recent history:   Most recent A1c is 7.4 compared to 12.6 in June  INSULIN regimen is: LANTUS 14 units, NovoLog 6 units before meals       Non-insulin hypoglycemic drugs the patient is taking are: Metformin ER 2g, Amaryl 2 mg in the morning, Invokana 100 mg daily  Current management, blood sugar patterns and problems identified:  She was started on basal bolus insulin regimen on her last visit about 3 weeks ago  She is now not able to get her Invokana because of lack of insurance coverage  Although her blood sugars may be somewhat better she is gaining weight and has gone up about 5 pounds  She was told to titrate her basal insulin based on her morning sugar but she did not understand the instructions and has not done so  Also she was given instructions on taking 10 units for covering her evening meal with NovoLog but she is taking 1 to 6 units  At home she has checked her sugars mostly before breakfast and suppertime when these are just above 200 most of the time  Lab glucose after lunch was also over 200 Still not finding the time or energy to do any exercise       Side effects from medications have been: Diarrhea from regular metformin  Compliance with the medical regimen: Improving             Exercise:   Walking infrequently   Glucose monitoring:  done less than 1 times a day         Glucometer:  [employer]       Blood Glucose readings    PRE-MEAL Fasting Lunch Dinner Bedtime Overall  Glucose range: 235 ? 215 ?   Mean/median:        POST-MEAL PC Breakfast PC Lunch PC Dinner  Glucose range:     Mean/median:         Dietician visit, most recent: 02/24/2018  Weight history:  Wt Readings from Last 3 Encounters:  04/27/18 (!) 326 lb 3.2 oz (148 kg)  04/19/18 (!) 323 lb 3.2 oz (146.6 kg)  04/05/18 (!) 321 lb (145.6 kg)    Glycemic control:   Lab Results  Component Value Date   HGBA1C 7.4 (A) 02/24/2018   HGBA1C 12.6 (A) 11/01/2017   Lab Results  Component Value Date   LDLCALC 95 03/03/2018   CREATININE 0.87 04/22/2018   No results found for: [facility]  Lab Results  Component Value Date   FRUCTOSAMINE 379 (H) 04/22/2018      Allergies as of 04/27/2018      Reactions   Aspirin Other (See Comments)   NO aspirin  Medication List        Accurate as of 04/27/18 10:11 AM. Always use your most recent med list.          acetaminophen 500 MG tablet Commonly known as:  TYLENOL Take 1,000 mg by mouth every 6 (six) hours as needed. Pain   albuterol 108 (90 Base) MCG/ACT inhaler Commonly known as:  PROVENTIL HFA;VENTOLIN HFA Inhale 1-2 puffs into the lungs every 6 (six) hours as needed for wheezing or shortness of breath.   atorvastatin 10 MG tablet Commonly known as:  LIPITOR Take 1 tablet (10 mg total) by mouth daily.   CINNAMON PO Take by mouth.   glimepiride 4 MG tablet Commonly known as:  AMARYL Take 4 mg by mouth daily with breakfast.   insulin aspart 100 UNIT/ML injection Commonly known as:  novoLOG Inject 6 Units into the skin 3 (three) times daily before meals. INJECT 6 UNITS UNDER THE SKIN 3 TIMES DAILY BEFORE MEALS.   insulin glargine 100 UNIT/ML injection Commonly known as:  LANTUS Inject 14 Units into the skin daily. INJECT  14 UNITS UNDER THE SKIN BEFORE BED.   medroxyPROGESTERone 150 MG/ML injection Commonly known as:  DEPO-PROVERA Inject 1 mL (150 mg total) into the muscle every 3 (three) months.   metFORMIN 500 MG 24 hr tablet Commonly known as:  GLUCOPHAGE-XR Take 1 tablet (500 mg total) by mouth daily with breakfast.   Pen Needles 32G X 6 MM Misc 1 each by Subcutaneous Infusion route as directed.   rizatriptan 10 MG tablet Commonly known as:  MAXALT Take 10 mg by mouth as needed for migraine. May repeat in 2 hours if needed   triamcinolone cream 0.1 % Commonly known as:  KENALOG Apply topically 2 (two) times daily. Apply to affected area       Allergies:  Allergies  Allergen Reactions  . Aspirin Other (See Comments)    NO aspirin    Past Medical History:  Diagnosis Date  . Asthma   . Diabetes mellitus without complication (HCC)   . Headache(784.0)   . High cholesterol   . Migraine   . Migraine   . Pericarditis   . Polycystic disease, ovaries     Past Surgical History:  Procedure Laterality Date  . BIOPSY ENDOMETRIAL      Family History  Problem Relation Age of Onset  . Lung disease Mother   . Diabetes Mother   . Heart disease Mother   . Hyperlipidemia Father     Social History:  reports that she quit smoking about 7 years ago. Her smoking use included cigarettes. She quit after 10.00 years of use. She has never used smokeless tobacco. She reports that she drank alcohol. She reports that she has current or past drug history.   Review of Systems   Lipid history: On treatment with Lipitor for about a year, baseline lipids not available    Lab Results  Component Value Date   CHOL 147 03/03/2018   HDL 31.20 (L) 03/03/2018   LDLCALC 95 03/03/2018   TRIG 103.0 03/03/2018   CHOLHDL 5 03/03/2018           Hypertension: Has not been present  BP Readings from Last 3 Encounters:  04/27/18 108/70  04/19/18 126/71  04/05/18 106/78    Most recent eye exam was in  2019  Most recent foot exam: 9/19  Currently known complications of diabetes: None  LABS:  Lab on 04/22/2018  Component Date Value Ref Range Status  .  Sodium 04/22/2018 140  135 - 145 mEq/L Final  . Potassium 04/22/2018 3.7  3.5 - 5.1 mEq/L Final  . Chloride 04/22/2018 106  96 - 112 mEq/L Final  . CO2 04/22/2018 27  19 - 32 mEq/L Final  . Glucose, Bld 04/22/2018 238* 70 - 99 mg/dL Final  . BUN 62/13/086511/22/2019 7  6 - 23 mg/dL Final  . Creatinine, Ser 04/22/2018 0.87  0.40 - 1.20 mg/dL Final  . Calcium 78/46/962911/22/2019 9.1  8.4 - 10.5 mg/dL Final  . GFR 52/84/132411/22/2019 94.32  >60.00 mL/min Final  . Fructosamine 04/22/2018 379* 0 - 285 umol/L Final   Comment: Published reference interval for apparently healthy subjects between age 36 and 9560 is 65205 - 285 umol/L and in a poorly controlled diabetic population is 228 - 563 umol/L with a mean of 396 umol/L.     Physical Examination:  BP 108/70 (BP Location: Left Arm, Patient Position: Sitting, Cuff Size: Normal)   Pulse 84   Ht 5\' 11"  (1.803 m)   Wt (!) 326 lb 3.2 oz (148 kg)   SpO2 98%   BMI 45.50 kg/m      ASSESSMENT:  Diabetes type 2 with morbid obesity  See history of present illness for detailed discussion of current diabetes management, blood sugar patterns and problems identified  Her blood sugars are only likely better with starting low-dose insulin As discussed above she has not followed instructions for the insulin doses, monitoring or significant lifestyle changes Her treatment is limited by lack of insurance coverage and currently not on Invokana also    Stevens:    Start titrating the Lantus insulin, flowsheet with instructions given today  She will go up to 18 units for the next 3 days and continue to go up 2 units every 3 days until morning sugars are below 130  Increase suppertime dose to 10 units at night emphasized the need to check readings after meals especially after supper  May need to adjust the dose again but  needs to at least keep her postprandial readings under 180  Regular walking for exercise  Given her contact information for getting patient assistance from Lantus manufacturer who can also give her Apidra  She will also contact Invokana manufacturer and contact information given for this  Follow-up in 3 weeks  Consider follow-up with dietitian     There are no Patient Instructions on file for this visit.     Reather LittlerAjay Teanna Elem 04/27/2018, 10:11 AM   Note: This office note was prepared with Dragon voice recognition system technology. Any transcriptional errors that result from this process are unintentional.

## 2018-04-27 NOTE — Patient Instructions (Addendum)
  Check blood sugars on waking up 7 days a week  Check insurance coverage  Also check blood sugars about 1-2 hours after meals and do this after different meals by rotation  Recommended blood sugar levels on waking up are 90-130 and about 2 hours after meal is 130-180  Please bring your blood sugar monitor to each visit, thank you  LANTUS 18 UNITS   Lantus insulin:  Start with 18 units at bedtime daily and increase by 2 units every 3 days until the waking up sugars are under 130. Then continue the same dose.  If blood sugar is under 90 for 2 days in a row, reduce the dose by 2 units.  Note that this insulin does not control the rise of blood sugar with meals       Novolog 6 units before small meals and 10 before main meal, keep 2 hr sugar < 180

## 2018-04-27 NOTE — Telephone Encounter (Signed)
Patient called and stated that her Lantus is in a shipped status and she should receive it by 05/02/18 or 05/03/18.  Patient also stated that she "did not need to do anything" for the Apidra.  It can be sent in.

## 2018-05-04 ENCOUNTER — Telehealth: Payer: Self-pay

## 2018-05-04 ENCOUNTER — Ambulatory Visit: Payer: No Typology Code available for payment source | Admitting: Endocrinology

## 2018-05-04 ENCOUNTER — Ambulatory Visit: Payer: Self-pay | Admitting: *Deleted

## 2018-05-04 NOTE — Telephone Encounter (Signed)
Pt called with checking her blood sugar on the way to work and got a reading of 431. She denies nausea, vomiting, dizziness. She is alert. She has taken her novolog this morning but has not eaten yet.  Advised to drink lots of water and to eat. She is also on metformin, glimepiride and Lantus. She will recheck her blood sugar and call back with results. [facility] [place] per protocol. Advised to call back for nausea, vomiting, weakness, dizziness, or difficulty breathing. Pt voiced understanding. Will wait for call back from patient.  Reason for Disposition . Blood glucose > 400 mg/dL (16.122.2 mmol/L)  Answer Assessment - Initial Assessment Questions 1. BLOOD GLUCOSE: "What is your blood glucose level?"      431 2. ONSET: "When did you check the blood glucose?"     This morning 3. USUAL RANGE: "What is your glucose level usually?" (e.g., usual fasting morning value, usual evening value)     Around 200's 4. KETONES: "Do you check for ketones (urine or blood test strips)?" If yes, ask: "What does the test show now?"      no 5. TYPE 1 or 2:  "Do you know what type of diabetes you have?"  (e.g., Type 1, Type 2, Gestational; doesn't know)      Type 2 6. INSULIN: "Do you take insulin?" "What type of insulin(s) do you use? What is the mode of delivery? (syringe, pen (e.g., injection or  pump)?"      lantus and novolog 7. DIABETES PILLS: "Do you take any pills for your diabetes?" If yes, ask: "Have you missed taking any pills recently?"     Metformin, glimepiride 8. OTHER SYMPTOMS: "Do you have any symptoms?" (e.g., fever, frequent urination, difficulty breathing, dizziness, weakness, vomiting)     Frequent urination, tired, weakness 9. PREGNANCY: "Is there any chance you are pregnant?" "When was your last menstrual period?"     Not pregnant. LMP was last period. Using an injection for Birth control  Protocols used: DIABETES - HIGH BLOOD SUGAR-A-AH

## 2018-05-04 NOTE — Telephone Encounter (Signed)
Spoke with [employer] state that pt insulin has been shipped out and it should be delivered to our office today 05/04/2018. Pt is aware

## 2018-05-04 NOTE — Telephone Encounter (Signed)
Called pt left a detailed message regarding her insulin, advised pt to pick up the insulin in the office.

## 2018-05-04 NOTE — Telephone Encounter (Signed)
Called pt left a detailed message regarding her insulin shipment, pt advised to return my call in the office

## 2018-05-05 ENCOUNTER — Telehealth: Payer: Self-pay | Admitting: Endocrinology

## 2018-05-05 ENCOUNTER — Other Ambulatory Visit: Payer: Self-pay | Admitting: Endocrinology

## 2018-05-05 NOTE — Telephone Encounter (Signed)
Second attempt Patient is out of needles. Patient is highly upset and states if her sugars gets high again she will go to the hospital.  "Patient has called stated that she had samples of her insulin but has run out of needels and wants to know if she can get some from Dr Lucianne MussKumar.  She also stated that she needs information about Lantus program   Call back number is [phone]"

## 2018-05-05 NOTE — Telephone Encounter (Signed)
Patient has called stated that she had samples of her insulin but has run out of needels and wants to know if she can get some from Dr Lucianne MussKumar.  She also stated that she needs information about Lantus program   Call back number is [phone]

## 2018-05-05 NOTE — Telephone Encounter (Signed)
Please call patient back and let her know if we have some syringes in the office she can pick up samples if ok with Dr. Lucianne MussKumar

## 2018-05-05 NOTE — Telephone Encounter (Signed)
Called patient back and pt stated that when she came to the office for her diabetic teaching, that she was given a small box of needles to start out with. Pt then stated that she thought the office provided the needles. Pt was informed that her information was incorrect. Pt stated that she did not know how she could get the needles she needed. She was then advised that [facility] sells the pen needles she is currently using over the counter for approx. $8 for a box of 50. Pt verbalized understanding and stated that she would go to the pharmacy and buy some.  Pt also stated that she was going to call the [facility] assistance [facility] and request that they start sending her insulin to Dr. Remus BlakeKumar's office rather than her PCP, who orgininally started her on insulin prior to being referred to Endocrinology. Once patient does this, she said that she would like this office to send a prescriptions to [facility] for apidra since dr. Lucianne MussKumar would like her to try this as well. [employer] stated that a new application does not have to be completed. They just need the MD portion of the application containing the medication information sent to them. Pt was informed that once [employer] is made aware that Dr. Lucianne MussKumar is managing her DM, rather than Dr. Salomon FickBanks, then the application will be sent in pending Dr. Remus BlakeKumar's appproval. Pt verbalized understanding and thanked this nurse for the time.

## 2018-05-05 NOTE — Telephone Encounter (Signed)
Already spoke with pt and [facility]

## 2018-05-06 NOTE — Telephone Encounter (Signed)
Pt picked up her insulin from the office yesterday 05/05/2018

## 2018-05-11 ENCOUNTER — Telehealth: Payer: Self-pay

## 2018-05-11 NOTE — Telephone Encounter (Signed)
Returned pt call regarding her Insulin pick up at the office. Pt stopped by the office and picked up the insulin

## 2018-05-17 ENCOUNTER — Ambulatory Visit (HOSPITAL_COMMUNITY)
Admission: EM | Admit: 2018-05-17 | Discharge: 2018-05-17 | Disposition: A | Discharge status: Home / Self Care | Payer: No Typology Code available for payment source | Attending: Internal Medicine | Admitting: Internal Medicine

## 2018-05-17 ENCOUNTER — Ambulatory Visit (INDEPENDENT_AMBULATORY_CARE_PROVIDER_SITE_OTHER): Payer: No Typology Code available for payment source

## 2018-05-17 ENCOUNTER — Encounter (HOSPITAL_COMMUNITY): Payer: Self-pay

## 2018-05-17 DIAGNOSIS — E1165 Type 2 diabetes mellitus with hyperglycemia: Secondary | ICD-10-CM | POA: Diagnosis not present

## 2018-05-17 DIAGNOSIS — J069 Acute upper respiratory infection, unspecified: Secondary | ICD-10-CM | POA: Diagnosis not present

## 2018-05-17 DIAGNOSIS — R05 Cough: Secondary | ICD-10-CM | POA: Diagnosis not present

## 2018-05-17 DIAGNOSIS — J4541 Moderate persistent asthma with (acute) exacerbation: Secondary | ICD-10-CM | POA: Diagnosis not present

## 2018-05-17 LAB — GLUCOSE, CAPILLARY: Glucose-Capillary: 267 mg/dL — ABNORMAL HIGH (ref 70–99)

## 2018-05-17 MED ORDER — IPRATROPIUM-ALBUTEROL 0.5-2.5 (3) MG/3ML IN SOLN
3.0000 mL | Freq: Once | RESPIRATORY_TRACT | Status: AC
  Administered 2018-05-17: 3 mL via RESPIRATORY_TRACT

## 2018-05-17 MED ORDER — ALBUTEROL SULFATE HFA 108 (90 BASE) MCG/ACT IN AERS
2.0000 | INHALATION_SPRAY | Freq: Once | RESPIRATORY_TRACT | Status: AC
  Administered 2018-05-17: 2 via RESPIRATORY_TRACT

## 2018-05-17 MED ORDER — IPRATROPIUM-ALBUTEROL 0.5-2.5 (3) MG/3ML IN SOLN
RESPIRATORY_TRACT | Status: AC
  Filled 2018-05-17: qty 3

## 2018-05-17 MED ORDER — GUAIFENESIN-CODEINE 100-10 MG/5ML PO SOLN: 10.0000 mL | Freq: Every day | ORAL | 0 refills | Status: DC

## 2018-05-17 MED ORDER — BENZONATATE 200 MG PO CAPS: 200.0000 mg | ORAL_CAPSULE | Freq: Three times a day (TID) | ORAL | 0 refills | Status: DC | PRN

## 2018-05-17 MED ORDER — ALBUTEROL SULFATE HFA 108 (90 BASE) MCG/ACT IN AERS
INHALATION_SPRAY | RESPIRATORY_TRACT | Status: AC
  Filled 2018-05-17: qty 6.7

## 2018-05-17 MED ORDER — AZITHROMYCIN 250 MG PO TABS: ORAL_TABLET | ORAL | 0 refills | Status: DC

## 2018-05-17 NOTE — Discharge Instructions (Signed)
Avoid any kind of dairy since this will make the mucous thicker. OK to get back on ones you are well.   Use your inhaler every 4-6 hours for the next 3 days, then as needed.   Follow with your family Dr this week

## 2018-05-17 NOTE — ED Triage Notes (Addendum)
Pt present coughing, nasal drainage and chills for 2 days.  Pt states when she cough her chest cavity hurts from coughing so hard.

## 2018-05-17 NOTE — ED Provider Notes (Signed)
[facility]    CSN: [id number] Arrival date & time: 05/17/18  1738     History   Chief Complaint Chief Complaint  Patient presents with  . Cough  . Nasal Congestion  *  HPI Miranda Stevens is a 37 y.o. female.   Who presents with onset of cough 2 days ago with productive cough with clear mucous. She has been taking Mucinex and yesterday coughed so much that felt chest pain under her ribs and mid breast bone area. She has not used her Proventil inhaler today. Has been sweating a lot today and has not checked her glucose. She ran out of her Novolog since her endocrinologist gave her a sample and has ran out of this. She is not prone of getting secondary infections when she gets colds. She has not taken her oral DM meds today. Has had glucose in the mid 260's since November.     Past Medical History:  Diagnosis Date  . Asthma   . Diabetes mellitus without complication (HCC)   . Headache(784.0)   . High cholesterol   . Migraine   . Migraine   . Pericarditis   . Polycystic disease, ovaries     Patient Active Problem List   Diagnosis Date Noted  . PCOS (polycystic ovarian syndrome) 02/24/2018  . Uncontrolled diabetes mellitus type 2 without complications (HCC) 11/01/2017  . Mild intermittent asthma without complication 11/01/2017  . History of migraine 11/01/2017  . Seasonal allergies 11/01/2017  . Cigarette nicotine dependence without complication 11/01/2017  . Snoring 11/01/2017  . General counseling for prescription of oral contraceptives 04/04/2012  . Excessive or frequent menstruation 04/04/2012    Past Surgical History:  Procedure Laterality Date  . BIOPSY ENDOMETRIAL      OB History    Gravida  0   Para      Term      Preterm      AB      Living  0     SAB      TAB      Ectopic      Multiple      Live Births               Home Medications    Prior to Admission medications   Medication Sig Start Date End Date  Taking? Authorizing Provider  acetaminophen (TYLENOL) 500 MG tablet Take 1,000 mg by mouth every 6 (six) hours as needed. Pain      [provider]  albuterol (PROVENTIL HFA;VENTOLIN HFA) 108 (90 Base) MCG/ACT inhaler Inhale 1-2 puffs into the lungs every 6 (six) hours as needed for wheezing or shortness of breath.    [provider]  atorvastatin (LIPITOR) 10 MG tablet Take 1 tablet (10 mg total) by mouth daily. 01/18/18   Deeann Saint, MD  azithromycin (ZITHROMAX Z-PAK) 250 MG tablet 2 today, then 1 qd x 4 days 05/17/18   Rodriguez-Southworth, Nettie Elm, PA-C  benzonatate (TESSALON) 200 MG capsule Take 1 capsule (200 mg total) by mouth 3 (three) times daily as needed for cough. 05/17/18   Rodriguez-Southworth, Nettie Elm, PA-C  CINNAMON PO Take by mouth.    [provider]  glimepiride (AMARYL) 4 MG tablet Take 4 mg by mouth daily with breakfast.    [provider]  guaiFENesin-codeine 100-10 MG/5ML syrup Take 10 mLs by mouth at bedtime. 05/17/18   Rodriguez-Southworth, Nettie Elm, PA-C  insulin aspart (NOVOLOG) 100 UNIT/ML injection Inject 6 Units into the skin 3 (  three) times daily before meals. INJECT 6 UNITS UNDER THE SKIN 3 TIMES DAILY BEFORE MEALS.    [provider]  insulin glargine (LANTUS) 100 UNIT/ML injection Inject 14 Units into the skin daily. INJECT 14 UNITS UNDER THE SKIN BEFORE BED.    [provider]  Insulin Pen Needle (PEN NEEDLES) 32G X 6 MM MISC 1 each by Subcutaneous Infusion route as directed. 11/01/17   Deeann Saint, MD  medroxyPROGESTERone (DEPO-PROVERA) 150 MG/ML injection Inject 1 mL (150 mg total) into the muscle every 3 (three) months. 11/02/17   Armando Reichert, CNM  metFORMIN (GLUCOPHAGE-XR) 500 MG 24 hr tablet Take 1 tablet (500 mg total) by mouth daily with breakfast. 03/03/18   Deeann Saint, MD  rizatriptan (MAXALT) 10 MG tablet Take 10 mg by mouth as needed for migraine. May repeat in 2 hours if needed    [provider]  triamcinolone cream (KENALOG) 0.1 % Apply topically 2 (two) times daily. Apply to affected area 03/15/12   Janne Napoleon, NP    Family History Family History  Problem Relation Age of Onset  . Lung disease Mother   . Diabetes Mother   . Heart disease Mother   . Hyperlipidemia Father     Social History Social History   Tobacco Use  . Smoking status: Former Smoker    Years: 10.00    Types: Cigarettes    Last attempt to quit: 01/31/2011    Years since quitting: 7.2  . Smokeless tobacco: Never Used  Substance Use Topics  . Alcohol use: Not Currently  . Drug use: Not Currently    Comment: laswt used 1 week ago     Allergies   Aspirin Review of Systems Review of Systems  Constitutional: Positive for appetite change and diaphoresis. Negative for chills and fever.  HENT: Positive for postnasal drip and sinus pain. Negative for congestion, ear discharge, ear pain, sinus pressure, sore throat and trouble swallowing.   Eyes: Negative for discharge.  Respiratory: Positive for cough, shortness of breath and wheezing. Negative for chest tightness.        Has rib pain  Cardiovascular: Positive for chest pain. Negative for palpitations.       Chest chest pain when she coughs and her ribs are sore  Gastrointestinal: Positive for nausea and vomiting. Negative for abdominal pain.       Cough and phlegm has caused to to vomit a few times.  Endocrine: Positive for polyuria.  Genitourinary: Positive for frequency. Negative for dysuria and urgency.  Musculoskeletal: Negative for gait problem and myalgias.  Skin: Negative for rash.  Allergic/Immunologic: Positive for environmental allergies.  Neurological: Negative for headaches.   Physical Exam Triage Vital Signs ED Triage Vitals [05/17/18 1753]  Enc Vitals Group     BP (!) 164/94     Pulse Rate 65     Resp 16     Temp 98.3 F (36.8 C)     Temp Source Oral     SpO2 99 %     Weight      Height      Head  Circumference      Peak Flow      Pain Score 7     Pain Loc      Pain Edu?      Excl. in GC?    No data found.  Updated Vital Signs BP 116/67 (BP Location: Left Arm)   Pulse 79   Temp 98.3 F (  36.8 C) (Oral)   Resp 18   SpO2 100%   Visual Acuity Right Eye Distance:   Left Eye Distance:   Bilateral Distance:    Right Eye Near:   Left Eye Near:    Bilateral Near:     Physical Exam Vitals signs and nursing note reviewed.  Constitutional:      General: She is not in acute distress.    Appearance: Normal appearance. She is diaphoretic. She is not ill-appearing or toxic-appearing.  HENT:     Head: Normocephalic.     Right Ear: Tympanic membrane, ear canal and external ear normal.     Left Ear: Tympanic membrane, ear canal and external ear normal.     Nose: Rhinorrhea present. No congestion.     Mouth/Throat:     Mouth: Mucous membranes are moist.  Eyes:     General: No scleral icterus.       Right eye: No discharge.        Left eye: No discharge.     Conjunctiva/sclera: Conjunctivae normal.  Neck:     Musculoskeletal: Normal range of motion. No neck rigidity.  Cardiovascular:     Rate and Rhythm: Normal rate and regular rhythm.     Heart sounds: No murmur.  Pulmonary:     Comments: Initially pt could not exhale due to causing her cough attacks. But after 2nd duo neb, was able to exhale better and felt better. Her lower anterior ribs are tender to palpation, there are no crepitations or swelling Musculoskeletal: Normal range of motion.  Lymphadenopathy:     Cervical: No cervical adenopathy.  Skin:    General: Skin is warm.     Coloration: Skin is not jaundiced.     Findings: No bruising or rash.  Neurological:     General: No focal deficit present.     Mental Status: She is alert and oriented to person, place, and time.  Psychiatric:        Mood and Affect: Mood normal.        Behavior: Behavior normal.        Thought Content: Thought content normal.         Judgment: Judgment normal.    UC Treatments / Results  Labs (all labs ordered are listed, but only abnormal results are displayed) Labs Reviewed  GLUCOSE, CAPILLARY - Abnormal; Notable for the following components:      Result Value   Glucose-Capillary 267 (*)    All other components within normal limits  CBG MONITORING, ED    EKG None  Radiology Neg CXR read by radiologist.   Procedures Duo neb  Medications Ordered in UC Medications  ipratropium-albuterol (DUONEB) 0.5-2.5 (3) MG/3ML nebulizer solution 3 mL (3 mLs Nebulization Given 05/17/18 1808)  albuterol (PROVENTIL HFA;VENTOLIN HFA) 108 (90 Base) MCG/ACT inhaler 2 puff (2 puffs Inhalation Given 05/17/18 1849)  ipratropium-albuterol (DUONEB) 0.5-2.5 (3) MG/3ML nebulizer solution 3 mL (3 mLs Nebulization Given 05/17/18 1922)    Initial Impression / Assessment and Plan / UC Course  I have reviewed the triage vital signs and the nursing notes. Pertinent labs & imaging results that were available during my care of the patient were reviewed by me and considered in my medical decision making (see chart for details). She was given a second neb treatment since the inhaler that she was given her provoked more cough attacks.  She took 4 mg of her Amaril while here. Will use her night time insulin when she gets home.  Advised to use her inhaler q 4 h for 3 days then prn.  I placed her on Zpack, Rob AC qhs and Tessalon for day time.  Needs to see PCP this week for FU.  She was also shown a video how to do netie pot saline rinses which I recommends she does since she started getting rhinitis while here.  Final Clinical Impressions(s) / UC Diagnoses   Final diagnoses:  Moderate persistent asthma with exacerbation  Acute upper respiratory infection     Discharge Instructions     Avoid any kind of dairy since this will make the mucous thicker. OK to get back on ones you are well.   Use your inhaler every 4-6 hours for the next 3  days, then as needed.   Follow with your family Dr this week     ED Prescriptions    Medication Sig Dispense Auth. Provider   benzonatate (TESSALON) 200 MG capsule Take 1 capsule (200 mg total) by mouth 3 (three) times daily as needed for cough. 30 capsule Rodriguez-Southworth, Donae Kueker, PA-C   guaiFENesin-codeine 100-10 MG/5ML syrup Take 10 mLs by mouth at bedtime. 120 mL Rodriguez-Southworth, Zahmir Lalla, PA-C   azithromycin (ZITHROMAX Z-PAK) 250 MG tablet 2 today, then 1 qd x 4 days 6 tablet Rodriguez-Southworth, Nettie ElmSylvia, PA-C     Controlled Substance Prescriptions [place] Controlled Substance Registry consulted? no   Garey HamRodriguez-Southworth, Hadeel Hillebrand, [place]PA-C 05/17/18 1947

## 2018-05-19 ENCOUNTER — Other Ambulatory Visit: Payer: Self-pay

## 2018-05-19 ENCOUNTER — Ambulatory Visit: Payer: Self-pay

## 2018-05-19 MED ORDER — INSULIN GLARGINE 100 UNIT/ML ~~LOC~~ SOLN: 14.0000 [IU] | Freq: Every day | SUBCUTANEOUS | 4 refills | Status: DC

## 2018-05-19 NOTE — Telephone Encounter (Signed)
Patient called in with c/o "feeling tired, weak." She says as she's crying on the phone "I went to the UC on Tuesday and have an Upper Respiratory Infection. Before that, I was feeling tired and weak. My body aches from my legs up and I just don't feel like doing anything. I feel hot, my blood sugar was 293 this morning and I just don't feel good. My appetite is not good and I know I need to eat being a diabetic. I only eat about 1 meal a day, but I am drinking water and diet ginger ale." I asked how long has she felt this way, she says "for about 3 days." I asked about other symptoms, she says "the coughing is better, the SOB is better. I feel hot, but don't have a thermometer. I have diarrhea, pain under my breast from coughing and aching in my legs on up my body." According to protocol, see PCP within 24 hours, no availability with PCP, appointment scheduled for tomorrow at 1400 with Dr. Hassan RowanKoberlein, care advice given, patient verbalized understanding. Advised to take Tylenol or Ibuprofen for the fever and body aches, she verbalized understanding and says she will go buy a thermometer.   Reason for Disposition . [1] MODERATE weakness (i.e., interferes with work, school, normal activities) AND [2] persists > 3 days  Answer Assessment - Initial Assessment Questions 1. DESCRIPTION: "Describe how you are feeling."     Tired, wanting to sleep 2. SEVERITY: "How bad is it?"  "Can you stand and walk?"   - MILD - Feels weak or tired, but does not interfere with work, school or normal activities   - MODERATE - Able to stand and walk; weakness interferes with work, school, or normal activities   - SEVERE - Unable to stand or walk     Moderate 3. ONSET:  "When did the weakness begin?"     For a lot of days, 3 days 4. CAUSE: "What do you think is causing the weakness?"     I don't know, maybe diabetes 5. MEDICINES: "Have you recently started a new medicine or had a change in the amount of a medicine?"  Z-pack, Tessalon Pearles, guafenisin-codeine 6. OTHER SYMPTOMS: "Do you have any other symptoms?" (e.g., chest pain, fever, cough, SOB, vomiting, diarrhea, bleeding, other areas of pain)     Fever, cough, diarrhea, pain under breast, aching from legs on up 7. PREGNANCY: "Is there any chance you are pregnant?" "When was your last menstrual period?"     No; LMP beginning of year; on depo  Protocols used: WEAKNESS (GENERALIZED) AND FATIGUE-A-AH

## 2018-05-20 ENCOUNTER — Ambulatory Visit (INDEPENDENT_AMBULATORY_CARE_PROVIDER_SITE_OTHER): Payer: No Typology Code available for payment source | Admitting: Family Medicine

## 2018-05-20 ENCOUNTER — Encounter: Payer: Self-pay | Admitting: Family Medicine

## 2018-05-20 VITALS — BP 120/70 | Temp 98.4°F | Wt 321.8 lb

## 2018-05-20 DIAGNOSIS — E1165 Type 2 diabetes mellitus with hyperglycemia: Secondary | ICD-10-CM | POA: Diagnosis not present

## 2018-05-20 DIAGNOSIS — IMO0001 Reserved for inherently not codable concepts without codable children: Secondary | ICD-10-CM

## 2018-05-20 DIAGNOSIS — B370 Candidal stomatitis: Secondary | ICD-10-CM | POA: Diagnosis not present

## 2018-05-20 LAB — GLUCOSE, POCT (MANUAL RESULT ENTRY): POC Glucose: 293 mg/dl — AB (ref 70–99)

## 2018-05-20 MED ORDER — MAGIC MOUTHWASH: 5.0000 mL | Freq: Four times a day (QID) | ORAL | 1 refills | Status: DC | PRN

## 2018-05-20 NOTE — Progress Notes (Signed)
Miranda Charlynn GrimesYvonne Machuca DOB: 05/11/1981 Encounter date: 05/20/2018  This is a 37 y.o. female who presents with Chief Complaint  Patient presents with  . Follow-up    pain in the top of mouth, weakness, fatigue, patient notes no improvement since visit with Dr. Salomon FickBanks    History of present illness:  Yesterday had a "break down". Depression came over her, feeling weak, tired, URI she is going through. Pain up under chest. Ribs are sore. Went to urgent care - told ribs were "bruised". Now that she is on antibiotic worried about thrush. Roof of mouth is sore. More irritating than anything else.   Breathing is worse when she is out in cold weather.    Out of novolog for a week and a half. Trying to get sample through endocrinology. Only on lantus, glimepiride, metformin. Was doing 6 units novolog am, pm and then 10 units dinner. Taking lantus 18 units at bedtime. Sugars between 200-290 in morning. Titrating lantus up per endocrinology.   Cough is somewhat better.   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  Allergies  Allergen Reactions  . Aspirin Other (See Comments)    NO aspirin   Current Meds  Medication Sig  . acetaminophen (TYLENOL) 500 MG tablet Take 1,000 mg by mouth every 6 (six) hours as needed. Pain    . albuterol (PROVENTIL HFA;VENTOLIN HFA) 108 (90 Base) MCG/ACT inhaler Inhale 1-2 puffs into the lungs every 6 (six) hours as needed for wheezing or shortness of breath.  Marland Kitchen. atorvastatin (LIPITOR) 10 MG tablet Take 1 tablet (10 mg total) by mouth daily.  Marland Kitchen. azithromycin (ZITHROMAX Z-PAK) 250 MG tablet 2 today, then 1 qd x 4 days  . benzonatate (TESSALON) 200 MG capsule Take 1 capsule (200 mg total) by mouth 3 (three) times daily as needed for cough.  Marland Kitchen. CINNAMON PO Take by mouth.  Marland Kitchen. glimepiride (AMARYL) 4 MG tablet Take 4 mg by mouth daily with breakfast.  . guaiFENesin-codeine 100-10 MG/5ML syrup Take 10 mLs by mouth at bedtime.  . insulin glargine  (LANTUS) 100 UNIT/ML injection Inject 0.14 mLs (14 Units total) into the skin daily. INJECT 14 UNITS UNDER THE SKIN BEFORE BED.  . Insulin Pen Needle (PEN NEEDLES) 32G X 6 MM MISC 1 each by Subcutaneous Infusion route as directed.  . medroxyPROGESTERone (DEPO-PROVERA) 150 MG/ML injection Inject 1 mL (150 mg total) into the muscle every 3 (three) months.  . metFORMIN (GLUCOPHAGE-XR) 500 MG 24 hr tablet Take 1 tablet (500 mg total) by mouth daily with breakfast.  . rizatriptan (MAXALT) 10 MG tablet Take 10 mg by mouth as needed for migraine. May repeat in 2 hours if needed  . triamcinolone cream (KENALOG) 0.1 % Apply topically 2 (two) times daily. Apply to affected area    Review of Systems  Constitutional: Negative for chills, fatigue and fever.  HENT: Positive for congestion and postnasal drip.   Respiratory: Positive for cough and shortness of breath. Negative for chest tightness and wheezing (improving).   Cardiovascular: Negative for chest pain, palpitations and leg swelling.    Objective:  BP 120/70 (BP Location: Left Arm, Patient Position: Sitting, Cuff Size: Large)   Temp 98.4 F (36.9 C) (Oral)   Wt (!) 321 lb 12.8 oz (146 kg)   BMI 44.88 kg/m   Weight: (!) 321 lb 12.8 oz (146 kg)   BP Readings from Last 3 Encounters:  05/20/18 120/70  05/17/18 116/67  04/27/18 108/70   Wt  Readings from Last 3 Encounters:  05/20/18 (!) 321 lb 12.8 oz (146 kg)  04/27/18 (!) 326 lb 3.2 oz (148 kg)  04/19/18 (!) 323 lb 3.2 oz (146.6 kg)    Physical Exam Constitutional:      General: She is not in acute distress.    Appearance: She is well-developed.  HENT:     Head: Normocephalic and atraumatic.     Right Ear: Tympanic membrane, ear canal and external ear normal.     Left Ear: Tympanic membrane, ear canal and external ear normal.     Nose: Mucosal edema present.     Right Sinus: No maxillary sinus tenderness.     Left Sinus: No maxillary sinus tenderness or frontal sinus tenderness.      Mouth/Throat:     Pharynx: Uvula midline. Posterior oropharyngeal erythema present.     Comments: There is some edema left center roof of mouth with light erythema. Early thrush vs papular rash noted.  Cardiovascular:     Rate and Rhythm: Normal rate and regular rhythm.  Pulmonary:     Effort: Pulmonary effort is normal. No respiratory distress.     Breath sounds: Normal breath sounds. No wheezing, rhonchi or rales.  Lymphadenopathy:     Head:     Right side of head: No submandibular adenopathy.     Left side of head: No submandibular adenopathy.     Assessment/Plan  1. Uncontrolled diabetes mellitus type 2 without complications (HCC) We didn't have novolog samples in office, but I encouraged her to call her endocrinologist to see if they had any to bridge her until she gets the patient assistance completed. We discussed that keeping sugar controlled will help with recovery from illness and esp with concern for thrush.  - POCT glucose (manual entry)  2. Thrush Magic mouthwash rx given. Let us know if any worsening. We did price check this and it should be affordable for her. I do feel like she is improving from other URI and asthma sx.but I did encourage her to closely monitor.     Return if symptoms worsen or fail to improve.      Theodis ShoveJunell Nyeemah Jennette, MD

## 2018-06-03 ENCOUNTER — Other Ambulatory Visit: Payer: No Typology Code available for payment source

## 2018-06-10 ENCOUNTER — Ambulatory Visit: Payer: No Typology Code available for payment source | Admitting: Endocrinology

## 2018-06-21 ENCOUNTER — Telehealth: Payer: Self-pay | Admitting: *Deleted

## 2018-06-21 NOTE — Telephone Encounter (Signed)
Miranda Stevens called and left a voice message this am wanting to know the earliest she can get her depo shot.

## 2018-06-22 ENCOUNTER — Ambulatory Visit (INDEPENDENT_AMBULATORY_CARE_PROVIDER_SITE_OTHER): Payer: PRIVATE HEALTH INSURANCE | Admitting: General Practice

## 2018-06-22 ENCOUNTER — Encounter: Payer: Self-pay | Admitting: Family Medicine

## 2018-06-22 VITALS — BP 134/82 | HR 77 | Ht 71.0 in | Wt 317.0 lb

## 2018-06-22 DIAGNOSIS — Z3042 Encounter for surveillance of injectable contraceptive: Secondary | ICD-10-CM | POA: Diagnosis not present

## 2018-06-22 MED ORDER — MEDROXYPROGESTERONE ACETATE 150 MG/ML IM SUSP
150.0000 mg | Freq: Once | INTRAMUSCULAR | Status: AC
  Administered 2018-06-22: 150 mg via INTRAMUSCULAR

## 2018-06-22 NOTE — Telephone Encounter (Signed)
Returned pt call to advise her of her appointment today at 130 pm for a nurse visit for depo injection. Pt verbalized understanding and confirmed appointment.

## 2018-06-22 NOTE — Progress Notes (Signed)
Miranda Stevens here for Depo-Provera  Injection.  Injection administered without complication. Patient will return in 3 months for next injection.  Marylynn Pearson, RN 06/22/2018  1:41 PM

## 2018-06-22 NOTE — Progress Notes (Signed)
I have reviewed this chart and agree with the RN/CMA assessment and management.    Emunah Texidor C Azelie Noguera, MD, FACOG Attending Physician, [facility]  

## 2018-06-26 ENCOUNTER — Other Ambulatory Visit: Payer: Self-pay

## 2018-06-26 ENCOUNTER — Emergency Department (HOSPITAL_COMMUNITY)
Admission: EM | Admit: 2018-06-26 | Discharge: 2018-06-27 | Disposition: A | Discharge status: Home / Self Care | Payer: PRIVATE HEALTH INSURANCE | Attending: Emergency Medicine | Admitting: Emergency Medicine

## 2018-06-26 ENCOUNTER — Encounter (HOSPITAL_COMMUNITY): Payer: Self-pay

## 2018-06-26 DIAGNOSIS — Z87891 Personal history of nicotine dependence: Secondary | ICD-10-CM | POA: Diagnosis not present

## 2018-06-26 DIAGNOSIS — E1165 Type 2 diabetes mellitus with hyperglycemia: Secondary | ICD-10-CM | POA: Diagnosis present

## 2018-06-26 DIAGNOSIS — Z79899 Other long term (current) drug therapy: Secondary | ICD-10-CM | POA: Diagnosis not present

## 2018-06-26 DIAGNOSIS — Z794 Long term (current) use of insulin: Secondary | ICD-10-CM | POA: Diagnosis not present

## 2018-06-26 DIAGNOSIS — R739 Hyperglycemia, unspecified: Secondary | ICD-10-CM

## 2018-06-26 DIAGNOSIS — J4520 Mild intermittent asthma, uncomplicated: Secondary | ICD-10-CM | POA: Insufficient documentation

## 2018-06-26 LAB — BLOOD GAS, VENOUS
Acid-base deficit: 0.1 mmol/L (ref 0.0–2.0)
Bicarbonate: 23.2 mmol/L (ref 20.0–28.0)
FIO2: 21
O2 SAT: 85 %
Patient temperature: 98.6
pCO2, Ven: 35.4 mmHg — ABNORMAL LOW (ref 44.0–60.0)
pH, Ven: 7.431 — ABNORMAL HIGH (ref 7.250–7.430)
pO2, Ven: 49.7 mmHg — ABNORMAL HIGH (ref 32.0–45.0)

## 2018-06-26 LAB — URINALYSIS, ROUTINE W REFLEX MICROSCOPIC
Bacteria, UA: NONE SEEN
Bilirubin Urine: NEGATIVE
Glucose, UA: >=500 mg/dL — AB
Ketones, ur: 5 mg/dL — AB
Leukocytes, UA: NEGATIVE
Nitrite: NEGATIVE
PH: 5 (ref 5.0–8.0)
Protein, ur: NEGATIVE mg/dL
SPECIFIC GRAVITY, URINE: 1.023 (ref 1.005–1.030)

## 2018-06-26 LAB — CBG MONITORING, ED: Glucose-Capillary: 492 mg/dL — ABNORMAL HIGH (ref 70–99)

## 2018-06-26 LAB — I-STAT BETA HCG BLOOD, ED (MC, WL, AP ONLY): I-stat hCG, quantitative: <5 m[IU]/mL (ref <5)

## 2018-06-26 MED ORDER — INSULIN ASPART 100 UNIT/ML ~~LOC~~ SOLN
8.0000 [IU] | Freq: Once | SUBCUTANEOUS | Status: AC
  Administered 2018-06-26: 8 [IU] via SUBCUTANEOUS
  Filled 2018-06-26: qty 1

## 2018-06-26 MED ORDER — SODIUM CHLORIDE 0.9 % IV BOLUS
1000.0000 mL | Freq: Once | INTRAVENOUS | Status: AC
  Administered 2018-06-26: 1000 mL via INTRAVENOUS

## 2018-06-26 NOTE — ED Provider Notes (Signed)
[facility]-EMERGENCY DEPT Provider Note   CSN: [id number] Arrival date & time: 06/26/18  2145     History   Chief Complaint Chief Complaint  Patient presents with  . Hyperglycemia    HPI Miranda Stevens is a 38 y.o. female.  Patient with history of diabetes on insulin as well as p.o. medications presenting with hyperglycemia.  States her blood sugar at home was over 500 this evening around 8 PM.  She took her usual 6 units of NovoLog as well as 14 units of Lantus as well as her p.o. medications Metformin and glimepiride.  She states that she is having increased thirst and increased urination.  Denies any fevers, chills, nausea or vomiting.  No abdominal pain.  No dizziness or lightheadedness.  States compliance with her insulin regimen.  Admits to eating a piece of cake today for her friend's birthday and thinks that is what put her over the edge.  Sugar on arrival was 497.  She denies any pain.  No nausea.  No pain with urination or blood in the urine.  States compliance with her diabetic regimen otherwise.  States her sugars are normally in the 300 range.  The history is provided by the patient.  Hyperglycemia  Associated symptoms: fatigue   Associated symptoms: no abdominal pain, no chest pain, no dizziness, no fever, no nausea, no shortness of breath, no vomiting and no weakness     Past Medical History:  Diagnosis Date  . Asthma   . Diabetes mellitus without complication (HCC)   . Headache(784.0)   . High cholesterol   . Migraine   . Migraine   . Pericarditis   . Polycystic disease, ovaries     Patient Active Problem List   Diagnosis Date Noted  . PCOS (polycystic ovarian syndrome) 02/24/2018  . Uncontrolled diabetes mellitus type 2 without complications (HCC) 11/01/2017  . Mild intermittent asthma without complication 11/01/2017  . History of migraine 11/01/2017  . Seasonal allergies 11/01/2017  . Cigarette nicotine dependence without  complication 11/01/2017  . Snoring 11/01/2017  . General counseling for prescription of oral contraceptives 04/04/2012  . Excessive or frequent menstruation 04/04/2012    Past Surgical History:  Procedure Laterality Date  . BIOPSY ENDOMETRIAL       OB History    Gravida  0   Para      Term      Preterm      AB      Living  0     SAB      TAB      Ectopic      Multiple      Live Births               Home Medications    Prior to Admission medications   Medication Sig Start Date End Date Taking? Authorizing Provider  acetaminophen (TYLENOL) 500 MG tablet Take 1,000 mg by mouth every 6 (six) hours as needed. Pain      [provider]  albuterol (PROVENTIL HFA;VENTOLIN HFA) 108 (90 Base) MCG/ACT inhaler Inhale 1-2 puffs into the lungs every 6 (six) hours as needed for wheezing or shortness of breath.    [provider]  atorvastatin (LIPITOR) 10 MG tablet Take 1 tablet (10 mg total) by mouth daily. 01/18/18   Deeann SaintBanks, Shannon R, MD  azithromycin (ZITHROMAX Z-PAK) 250 MG tablet 2 today, then 1 qd x 4 days 05/17/18   Rodriguez-Southworth, Nettie ElmSylvia, PA-C  benzonatate (TESSALON) 200  MG capsule Take 1 capsule (200 mg total) by mouth 3 (three) times daily as needed for cough. 05/17/18   Rodriguez-Southworth, Nettie Elm, PA-C  CINNAMON PO Take by mouth.    [provider]  glimepiride (AMARYL) 4 MG tablet Take 4 mg by mouth daily with breakfast.    [provider]  guaiFENesin-codeine 100-10 MG/5ML syrup Take 10 mLs by mouth at bedtime. 05/17/18   Rodriguez-Southworth, Nettie Elm, PA-C  insulin aspart (NOVOLOG) 100 UNIT/ML injection Inject 6 Units into the skin 3 (three) times daily before meals. INJECT 6 UNITS UNDER THE SKIN 3 TIMES DAILY BEFORE MEALS.    [provider]  insulin glargine (LANTUS) 100 UNIT/ML injection Inject 0.14 mLs (14 Units total) into the skin daily. INJECT 14 UNITS UNDER THE SKIN BEFORE BED. 05/19/18   Reather Littler, MD    Insulin Pen Needle (PEN NEEDLES) 32G X 6 MM MISC 1 each by Subcutaneous Infusion route as directed. 11/01/17   Deeann Saint, MD  magic mouthwash SOLN Take 5 mLs by mouth 4 (four) times daily as needed for mouth pain. 05/20/18   Wynn Banker, MD  medroxyPROGESTERone (DEPO-PROVERA) 150 MG/ML injection Inject 1 mL (150 mg total) into the muscle every 3 (three) months. 11/02/17   Armando Reichert, CNM  metFORMIN (GLUCOPHAGE-XR) 500 MG 24 hr tablet Take 1 tablet (500 mg total) by mouth daily with breakfast. 03/03/18   Deeann Saint, MD  rizatriptan (MAXALT) 10 MG tablet Take 10 mg by mouth as needed for migraine. May repeat in 2 hours if needed    [provider]  triamcinolone cream (KENALOG) 0.1 % Apply topically 2 (two) times daily. Apply to affected area 03/15/12   Janne Napoleon, NP    Family History Family History  Problem Relation Age of Onset  . Lung disease Mother   . Diabetes Mother   . Heart disease Mother   . Hyperlipidemia Father     Social History Social History   Tobacco Use  . Smoking status: Former Smoker    Years: 10.00    Types: Cigarettes    Last attempt to quit: 01/31/2011    Years since quitting: 7.4  . Smokeless tobacco: Never Used  Substance Use Topics  . Alcohol use: Not Currently  . Drug use: Not Currently    Comment: laswt used 1 week ago     Allergies   Aspirin   Review of Systems Review of Systems  Constitutional: Positive for fatigue. Negative for activity change, appetite change and fever.  HENT: Negative for congestion.   Respiratory: Negative for cough, chest tightness and shortness of breath.   Cardiovascular: Negative for chest pain.  Gastrointestinal: Negative for abdominal pain, nausea and vomiting.  Genitourinary: Positive for frequency and urgency. Negative for difficulty urinating, flank pain, vaginal bleeding and vaginal discharge.  Musculoskeletal: Negative for arthralgias and myalgias.  Neurological: Negative for  dizziness, weakness and headaches.    all other systems are negative except as noted in the HPI and PMH.    Physical Exam Updated Vital Signs BP 115/77 (BP Location: Left Arm)   Pulse 85   Temp 98.1 F (36.7 C) (Oral)   Resp 18   Ht 5\' 11"  (1.803 m)   Wt (!) 143.8 kg   SpO2 98%   BMI 44.21 kg/m   Physical Exam Vitals signs and nursing note reviewed.  Constitutional:      General: She is not in acute distress.    Appearance: She is well-developed.  HENT:     Head: Normocephalic and atraumatic.     Mouth/Throat:     Pharynx: No oropharyngeal exudate.  Eyes:     Conjunctiva/sclera: Conjunctivae normal.     Pupils: Pupils are equal, round, and reactive to light.  Neck:     Musculoskeletal: Normal range of motion and neck supple.     Comments: No meningismus. Cardiovascular:     Rate and Rhythm: Normal rate and regular rhythm.     Heart sounds: Normal heart sounds. No murmur.  Pulmonary:     Effort: Pulmonary effort is normal. No respiratory distress.     Breath sounds: Normal breath sounds.  Chest:     Chest wall: No tenderness.  Abdominal:     Palpations: Abdomen is soft.     Tenderness: There is no abdominal tenderness. There is no guarding or rebound.  Musculoskeletal: Normal range of motion.        General: No tenderness.  Skin:    General: Skin is warm.     Capillary Refill: Capillary refill takes less than 2 seconds.  Neurological:     General: No focal deficit present.     Mental Status: She is alert and oriented to person, place, and time. Mental status is at baseline.     Cranial Nerves: No cranial nerve deficit.     Motor: No abnormal muscle tone.     Coordination: Coordination normal.     Comments: No ataxia on finger to nose bilaterally. No pronator drift. 5/5 strength throughout. CN 2-12 intact.Equal grip strength. Sensation intact.   Psychiatric:        Behavior: Behavior normal.      ED Treatments / Results  Labs (all labs ordered are listed,  but only abnormal results are displayed) Labs Reviewed  BASIC METABOLIC PANEL - Abnormal; Notable for the following components:      Result Value   Sodium 134 (*)    Glucose, Bld 428 (*)    Creatinine, Ser 1.03 (*)    All other components within normal limits  URINALYSIS, ROUTINE W REFLEX MICROSCOPIC - Abnormal; Notable for the following components:   Color, Urine STRAW (*)    APPearance HAZY (*)    Glucose, UA >=500 (*)    Hgb urine dipstick LARGE (*)    Ketones, ur 5 (*)    All other components within normal limits  BLOOD GAS, VENOUS - Abnormal; Notable for the following components:   pH, Ven 7.431 (*)    pCO2, Ven 35.4 (*)    pO2, Ven 49.7 (*)    All other components within normal limits  LACTIC ACID, PLASMA - Abnormal; Notable for the following components:   Lactic Acid, Venous 2.0 (*)    All other components within normal limits  BASIC METABOLIC PANEL - Abnormal; Notable for the following components:   CO2 21 (*)    Glucose, Bld 334 (*)    All other components within normal limits  CBG MONITORING, ED - Abnormal; Notable for the following components:   Glucose-Capillary 492 (*)    All other components within normal limits  CBG MONITORING, ED - Abnormal; Notable for the following components:   Glucose-Capillary 321 (*)    All other components within normal limits  CBC  LACTIC ACID, PLASMA  I-STAT BETA HCG BLOOD, ED ([facility], [facility], [facility] ONLY)    EKG None  Radiology No results found.  Procedures Procedures (including critical care time)  Medications Ordered in ED Medications  sodium chloride  0.9 % bolus 1,000 mL (has no administration in time range)  insulin aspart (novoLOG) injection 8 Units (has no administration in time range)     Initial Impression / Assessment and Plan / ED Course  I have reviewed the triage vital signs and the nursing notes.  Pertinent labs & imaging results that were available during my care of the patient were reviewed by me and considered in  my medical decision making (see chart for details).    Hyperglycemia in setting of dietary indiscretions. Patient will be hydrated and labs will be checked to r/o DKA.  Patient given IV fluids as well as subcutaneous insulin.  Labs show hyperglycemia with normal anion gap.  Bicarb is 22, anion gap is 10.  Small ketones in urine.  Doubt DKA.  Patient will be hydrated and given additional insulin.  No vomiting in the ED.  Abdomen soft.  Blood sugar improved to 334.  Normal anion gap with normal bicarbonate.  No evidence of DKA.  Patient tolerating p.o. in the ED.  She is given a prescription for her NovoLog with states her doctor is in the process of changing her medications.  Advised PCP follow-up.  Suspect hyperglycemia in setting of dietary indiscretions. Return precautions discussed. Final Clinical Impressions(s) / ED Diagnoses   Final diagnoses:  Hyperglycemia    ED Discharge Orders    None       Jaquita Bessire, Jeannett Senior, MD 06/27/18 [id number]

## 2018-06-26 NOTE — ED Notes (Signed)
Urine culture sent to the lab. 

## 2018-06-26 NOTE — ED Triage Notes (Signed)
Pt reports that she checked her sugar around 8p and it was over 500. She states that she took her insulin and metformin around 815p. Denies pain, nausea, or vomiting. Endorses polyuria. States that she ate a piece of cake today.

## 2018-06-27 ENCOUNTER — Telehealth: Payer: Self-pay | Admitting: Endocrinology

## 2018-06-27 ENCOUNTER — Other Ambulatory Visit: Payer: Self-pay

## 2018-06-27 LAB — BASIC METABOLIC PANEL
Anion gap: 10 (ref 5–15)
Anion gap: 8 (ref 5–15)
BUN: 17 mg/dL (ref 6–20)
BUN: 15 mg/dL (ref 6–20)
CHLORIDE: 102 mmol/L (ref 98–111)
CO2: 22 mmol/L (ref 22–32)
CO2: 21 mmol/L — ABNORMAL LOW (ref 22–32)
Calcium: 9.5 mg/dL (ref 8.9–10.3)
Calcium: 8.9 mg/dL (ref 8.9–10.3)
Chloride: 106 mmol/L (ref 98–111)
Creatinine, Ser: 1.03 mg/dL — ABNORMAL HIGH (ref 0.44–1.00)
Creatinine, Ser: 0.96 mg/dL (ref 0.44–1.00)
GFR calc Af Amer: >60 mL/min (ref >60)
GFR calc Af Amer: >60 mL/min (ref >60)
GFR calc non Af Amer: >60 mL/min (ref >60)
GLUCOSE: 334 mg/dL — AB (ref 70–99)
Glucose, Bld: 428 mg/dL — ABNORMAL HIGH (ref 70–99)
Potassium: 3.9 mmol/L (ref 3.5–5.1)
Potassium: 3.8 mmol/L (ref 3.5–5.1)
Sodium: 134 mmol/L — ABNORMAL LOW (ref 135–145)
Sodium: 135 mmol/L (ref 135–145)

## 2018-06-27 LAB — CBG MONITORING, ED: Glucose-Capillary: 321 mg/dL — ABNORMAL HIGH (ref 70–99)

## 2018-06-27 LAB — CBC
HCT: 43.7 % (ref 36.0–46.0)
Hemoglobin: 14.2 g/dL (ref 12.0–15.0)
MCH: 28.5 pg (ref 26.0–34.0)
MCHC: 32.5 g/dL (ref 30.0–36.0)
MCV: 87.6 fL (ref 80.0–100.0)
NRBC: 0 % (ref 0.0–0.2)
Platelets: 193 10*3/uL (ref 150–400)
RBC: 4.99 MIL/uL (ref 3.87–5.11)
RDW: 12.9 % (ref 11.5–15.5)
WBC: 8.1 10*3/uL (ref 4.0–10.5)

## 2018-06-27 LAB — LACTIC ACID, PLASMA: Lactic Acid, Venous: 2 mmol/L (ref 0.5–1.9)

## 2018-06-27 MED ORDER — INSULIN ASPART 100 UNIT/ML ~~LOC~~ SOLN
8.0000 [IU] | Freq: Once | SUBCUTANEOUS | Status: DC
  Filled 2018-06-27: qty 1

## 2018-06-27 MED ORDER — SODIUM CHLORIDE 0.9 % IV BOLUS
1000.0000 mL | Freq: Once | INTRAVENOUS | Status: AC
  Administered 2018-06-27: 1000 mL via INTRAVENOUS

## 2018-06-27 MED ORDER — INSULIN ASPART 100 UNIT/ML ~~LOC~~ SOLN: 6.0000 [IU] | Freq: Three times a day (TID) | SUBCUTANEOUS | 0 refills | Status: DC

## 2018-06-27 NOTE — Telephone Encounter (Signed)
Medication not on current med list. Reviewing with MD to see if he would like to add

## 2018-06-27 NOTE — Discharge Instructions (Addendum)
take your insulin and diabetic pills as prescribed.  Keep a record of your blood sugar.  Follow-up with your doctor.  Return to the ED if you develop new or worsening symptoms.

## 2018-06-27 NOTE — Telephone Encounter (Signed)
Patient stated she was suppose to have her prescription of APIDRA sent in the [facility] but it was sent into [facility].    Please advise

## 2018-07-05 ENCOUNTER — Encounter: Payer: Self-pay | Admitting: Endocrinology

## 2018-07-05 ENCOUNTER — Ambulatory Visit: Payer: No Typology Code available for payment source

## 2018-07-05 ENCOUNTER — Other Ambulatory Visit (INDEPENDENT_AMBULATORY_CARE_PROVIDER_SITE_OTHER): Payer: PRIVATE HEALTH INSURANCE

## 2018-07-05 DIAGNOSIS — E1165 Type 2 diabetes mellitus with hyperglycemia: Secondary | ICD-10-CM

## 2018-07-06 LAB — MICROALBUMIN / CREATININE URINE RATIO
Creatinine,U: 98.9 mg/dL
Microalb Creat Ratio: 6.5 mg/g (ref 0.0–30.0)
Microalb, Ur: 6.4 mg/dL — ABNORMAL HIGH (ref 0.0–1.9)

## 2018-07-06 LAB — GLUCOSE, RANDOM: Glucose, Bld: 273 mg/dL — ABNORMAL HIGH (ref 70–99)

## 2018-07-06 LAB — FRUCTOSAMINE: Fructosamine: 446 umol/L — ABNORMAL HIGH (ref 0–285)

## 2018-07-08 ENCOUNTER — Other Ambulatory Visit: Payer: Self-pay

## 2018-07-08 ENCOUNTER — Encounter: Payer: Self-pay | Admitting: Endocrinology

## 2018-07-08 ENCOUNTER — Ambulatory Visit (INDEPENDENT_AMBULATORY_CARE_PROVIDER_SITE_OTHER): Payer: PRIVATE HEALTH INSURANCE | Admitting: Endocrinology

## 2018-07-08 VITALS — BP 130/72 | HR 78 | Ht 71.0 in | Wt 317.6 lb

## 2018-07-08 DIAGNOSIS — E1165 Type 2 diabetes mellitus with hyperglycemia: Secondary | ICD-10-CM

## 2018-07-08 LAB — POCT GLYCOSYLATED HEMOGLOBIN (HGB A1C): Hemoglobin A1C: 11.7 % — AB (ref 4.0–5.6)

## 2018-07-08 LAB — GLUCOSE, POCT (MANUAL RESULT ENTRY): POC Glucose: 225 mg/dl — AB (ref 70–99)

## 2018-07-08 MED ORDER — INSULIN GLULISINE 100 UNIT/ML SOLOSTAR PEN: 20.0000 [IU] | PEN_INJECTOR | Freq: Three times a day (TID) | SUBCUTANEOUS | 4 refills | Status: DC

## 2018-07-08 MED ORDER — CANAGLIFLOZIN 100 MG PO TABS: ORAL_TABLET | ORAL | 3 refills | Status: DC

## 2018-07-08 NOTE — Progress Notes (Signed)
Patient ID: Miranda Stevens, female   DOB: 05/27/1981, 38 y.o.   MRN: [id number]           Reason for Appointment: Follow-up for Type 2 Diabetes  Referring physician: Abbe AmsterdamShannon Banks   History of Present Illness:          Date of diagnosis of type 2 diabetes mellitus: 2013       Background history:  She was diagnosed to have diabetes as part of her evaluation for PCOS with a glucose tolerance test She was then put on metformin which she has continued Most of her diabetes care has been in [name] and no records are available Because of her sugars going up last year Amaryl was added to her and metformin continued unchanged Not clear what her A1c was at that time  Recent history:   Most recent A1c is 11.7, previously was 7.4   INSULIN regimen is: LANTUS 18 units, NovoLog 0 units before meals       Non-insulin hypoglycemic drugs the patient is taking are: Metformin ER 0.5 g, Amaryl 2 mg in the morning, not on Invokana 100 mg daily  Current management, blood sugar patterns and problems identified:  She was urged to titrate her basal insulin on her last visit and given clear instructions on this but she only increase the dose by 2 units  Because of difficulty obtaining mealtime insulin she has not started or continued mealtime dose with NovoLog that was given as a sample  She was supposed to get her mealtime insulin with Apidra through her PCP but has not done so  Also she did not try to get Invokana through assistance program from the company as directed  Did not bring her meter or her any blood sugar records She did lose 6 pounds since her last visit which she had gained However not doing any consistent exercise       Side effects from medications have been: Diarrhea from regular metformin  Compliance with the medical regimen: Inconsistent  Glucose monitoring:  done less than 1 times a day         Glucometer:  [employer] brand       Blood Glucose readings by  recall:   PRE-MEAL Fasting Lunch Dinner Bedtime Overall  Glucose range: 255-300 250  250   Mean/median:          Dietician visit, most recent: 02/24/2018  Weight history:  Wt Readings from Last 3 Encounters:  07/08/18 (!) 317 lb 9.6 oz (144.1 kg)  06/26/18 (!) 317 lb (143.8 kg)  06/22/18 (!) 317 lb (143.8 kg)    Glycemic control:   Lab Results  Component Value Date   HGBA1C 11.7 (A) 07/08/2018   HGBA1C 7.4 (A) 02/24/2018   HGBA1C 12.6 (A) 11/01/2017   Lab Results  Component Value Date   MICROALBUR 6.4 (H) 07/05/2018   LDLCALC 95 03/03/2018   CREATININE 0.96 06/27/2018   Lab Results  Component Value Date   MICRALBCREAT 6.5 07/05/2018    Lab Results  Component Value Date   FRUCTOSAMINE 446 (H) 07/05/2018   FRUCTOSAMINE 379 (H) 04/22/2018      Allergies as of 07/08/2018      Reactions   Aspirin Other (See Comments)   NO aspirin      Medication List       Accurate as of July 08, 2018 11:59 PM. Always use your most recent med list.        acetaminophen 500 MG tablet Commonly  known as:  TYLENOL Take 1,000 mg by mouth every 6 (six) hours as needed for mild pain, moderate pain or headache. Pain   albuterol 108 (90 Base) MCG/ACT inhaler Commonly known as:  PROVENTIL HFA;VENTOLIN HFA Inhale 1-2 puffs into the lungs every 6 (six) hours as needed for wheezing or shortness of breath.   atorvastatin 10 MG tablet Commonly known as:  LIPITOR Take 1 tablet (10 mg total) by mouth daily.   canagliflozin 100 MG Tabs tablet Commonly known as:  INVOKANA 1 tablet before breakfast   CINNAMON PO Take 1 tablet by mouth daily.   glimepiride 4 MG tablet Commonly known as:  AMARYL Take 4 mg by mouth daily with breakfast. TAKE 1/2 TABLET BY MOUTH ONCE DAILY.   insulin glargine 100 UNIT/ML injection Commonly known as:  LANTUS Inject 0.14 mLs (14 Units total) into the skin daily. INJECT 14 UNITS UNDER THE SKIN BEFORE BED.   Insulin Glulisine 100 UNIT/ML Solostar  Pen Commonly known as:  APIDRA SOLOSTAR Inject 20 Units into the skin 3 (three) times daily. INJECT 20 UNITS UNDER THE SKIN THREE TIMES DAILY.   magic mouthwash Soln Take 5 mLs by mouth 4 (four) times daily as needed for mouth pain.   medroxyPROGESTERone 150 MG/ML injection Commonly known as:  DEPO-PROVERA Inject 1 mL (150 mg total) into the muscle every 3 (three) months.   metFORMIN 500 MG 24 hr tablet Commonly known as:  GLUCOPHAGE-XR Take 1 tablet (500 mg total) by mouth daily with breakfast.   Pen Needles 32G X 6 MM Misc 1 each by Subcutaneous Infusion route as directed.   rizatriptan 10 MG tablet Commonly known as:  MAXALT Take 10 mg by mouth as needed for migraine. May repeat in 2 hours if needed   triamcinolone cream 0.1 % Commonly known as:  KENALOG Apply topically 2 (two) times daily. Apply to affected area       Allergies:  Allergies  Allergen Reactions  . Aspirin Other (See Comments)    NO aspirin    Past Medical History:  Diagnosis Date  . Asthma   . Diabetes mellitus without complication (HCC)   . Headache(784.0)   . High cholesterol   . Migraine   . Migraine   . Pericarditis   . Polycystic disease, ovaries     Past Surgical History:  Procedure Laterality Date  . BIOPSY ENDOMETRIAL      Family History  Problem Relation Age of Onset  . Lung disease Mother   . Diabetes Mother   . Heart disease Mother   . Hyperlipidemia Father     Social History:  reports that she quit smoking about 7 years ago. Her smoking use included cigarettes. She quit after 10.00 years of use. She has never used smokeless tobacco. She reports previous alcohol use. She reports previous drug use.   Review of Systems   Lipid history: On treatment with Lipitor for several months prescribed by PCP with results as follows    Lab Results  Component Value Date   CHOL 147 03/03/2018   HDL 31.20 (L) 03/03/2018   LDLCALC 95 03/03/2018   TRIG 103.0 03/03/2018   CHOLHDL 5  03/03/2018           Hypertension: Has not been present  BP Readings from Last 3 Encounters:  07/08/18 130/72  06/27/18 132/90  06/22/18 134/82    Most recent eye exam was in 2019  Most recent foot exam: 9/19  Currently known complications of diabetes: None  LABS:  Office Visit on 07/08/2018  Component Date Value Ref Range Status  . Hemoglobin A1C 07/08/2018 11.7* 4.0 - 5.6 % Final  . POC Glucose 07/08/2018 225* 70 - 99 mg/dl Final  Lab on 09/81/191402/08/2018  Component Date Value Ref Range Status  . Glucose, Bld 07/05/2018 273* 70 - 99 mg/dL Final  . Microalb, Ur 78/29/562102/08/2018 6.4* 0.0 - 1.9 mg/dL Final  . Creatinine,U 30/86/578402/08/2018 98.9  mg/dL Final  . Microalb Creat Ratio 07/05/2018 6.5  0.0 - 30.0 mg/g Final  . Fructosamine 07/05/2018 446* 0 - 285 umol/L Final   Comment: Published reference interval for apparently healthy subjects between age 37 and 3360 is 86205 - 285 umol/L and in a poorly controlled diabetic population is 228 - 563 umol/L with a mean of 396 umol/L.     Physical Examination:  BP 130/72 (BP Location: Left Arm, Patient Position: Sitting, Cuff Size: Normal)   Pulse 78   Ht 5\' 11"  (1.803 m)   Wt (!) 317 lb 9.6 oz (144.1 kg)   BMI 44.30 kg/m      ASSESSMENT:  Diabetes type 2 with morbid obesity  See history of present illness for detailed discussion of current diabetes management, blood sugar patterns and problems identified  Her A1c has gone up significantly to 11.7  She is currently only on small doses of basal insulin along with 500 mg metformin and Amaryl  With not increasing her insulin and continued insulin resistance as well as taking smaller than prescribed doses of metformin she is still having consistent hyperglycemia about at home and in the office She does need basal bolus insulin but has had difficulty getting her mealtime insulin through patient assistant programs She does need to be more consistent with follow-up and monitoring blood  sugar Also has not done any exercise  Lipids: Controlled with Lipitor  History of PCOS: Even though she is on Provera she is having irregular bleeding and needs to follow-up with her gynecologist  PLAN:    Start titrating the Lantus insulin, again instructions written out for her  She can start with 30 units because of her marked hyperglycemia  She will start using Invokana with the co-pay card provided through her new insurance  She will also start Apidra when she is able to get this and information and forms for the patient assistance program have been filled out  She needs to check the coverage for freestyle libre sensor as well as the preferred glucose meter, discussed how the freestyle Josephine Igolibre will be able to give her much more information as needed throughout the day  She can check the coverage for her insurance for the preferred brand of mealtime insulin  Again given her detailed instructions and reminded her to make sure she reads the instructions which she did not follow on the last visit  Increase metformin to 1000 mg for 1 week and then 2000 mg a day  Consider follow-up with dietitian     Patient Instructions    When you start Apidra start with 10 units before each meal for average size meals and 15 units for larger meals   LANTUS insulin: This insulin provides blood sugar control for up to 24 hours.   Start with 30 units at 1 time daily and increase by 2 units every 3 days until the waking up sugars are under 130.  Then continue the same dose.  If blood sugar is under 90 for 2 days in a row, reduce the dose by  2 units.  Note that this insulin does not control the rise of blood sugar with meals  Take Invokana before breakfast daily  Stop glimepiride Increase metformin to 1 tablet twice a day for the next week and then 2 tablets twice a day  Check blood sugars on waking up 5-6 days a week  Also check blood sugars about 2 hours after meals and do this after  different meals by rotation  Recommended blood sugar levels on waking up are 90-130 and about 2 hours after meal is 130-160  Please bring your blood sugar monitor to each visit, thank you         Reather Littler 07/10/2018, 3:31 PM   Note: This office note was prepared with Dragon voice recognition system technology. Any transcriptional errors that result from this process are unintentional.

## 2018-07-08 NOTE — Patient Instructions (Signed)
  When you start Apidra start with 10 units before each meal for average size meals and 15 units for larger meals   LANTUS insulin: This insulin provides blood sugar control for up to 24 hours.   Start with 30 units at 1 time daily and increase by 2 units every 3 days until the waking up sugars are under 130.  Then continue the same dose.  If blood sugar is under 90 for 2 days in a row, reduce the dose by 2 units.  Note that this insulin does not control the rise of blood sugar with meals  Take Invokana before breakfast daily  Stop glimepiride Increase metformin to 1 tablet twice a day for the next week and then 2 tablets twice a day  Check blood sugars on waking up 5-6 days a week  Also check blood sugars about 2 hours after meals and do this after different meals by rotation  Recommended blood sugar levels on waking up are 90-130 and about 2 hours after meal is 130-160  Please bring your blood sugar monitor to each visit, thank you

## 2018-07-13 ENCOUNTER — Telehealth: Payer: Self-pay

## 2018-07-13 NOTE — Telephone Encounter (Signed)
PA initiated for Invokana 100mg  PO QD via [email or web address]. [id number] RX #: [id number]

## 2018-07-18 ENCOUNTER — Other Ambulatory Visit: Payer: Self-pay

## 2018-07-18 ENCOUNTER — Telehealth: Payer: Self-pay | Admitting: Endocrinology

## 2018-07-18 ENCOUNTER — Telehealth: Payer: Self-pay

## 2018-07-18 MED ORDER — INSULIN ASPART 100 UNIT/ML FLEXPEN: PEN_INJECTOR | SUBCUTANEOUS | 3 refills | Status: DC

## 2018-07-18 NOTE — Telephone Encounter (Signed)
Rx sent for 6 units SQ TID before meals per last office visit note. Pt stated that she can now afford Novolog through her insurance and she does not need the patient assistance Apidra.

## 2018-07-18 NOTE — Telephone Encounter (Signed)
MEDICATION: Animal nutritionist  PHARMACY:  [id number] - [place], [place] - [address]  IS THIS A 90 DAY SUPPLY : yes  IS PATIENT OUT OF MEDICATION: NO  IF NOT; HOW MUCH IS LEFT:   LAST APPOINTMENT DATE: @2 /05/2019  NEXT APPOINTMENT DATE:@3 /08/2018  DO WE HAVE YOUR PERMISSION TO LEAVE A DETAILED MESSAGE:OK  OTHER COMMENTS: per patient was given Novlog by ER and she has found that her insurance covers Novlog Pen   **Let patient know to contact pharmacy at the end of the day to make sure medication is ready. **  ** Please notify patient to allow 48-72 hours to process**  **Encourage patient to contact the pharmacy for refills or they can request refills through [facility]**

## 2018-07-18 NOTE — Telephone Encounter (Signed)
PA initiated via [email or web address] for Invokana 100mg , take 1 tablet by mouth once daily.  GBM:[id number]

## 2018-07-29 NOTE — Telephone Encounter (Signed)
Submitted appeal via [email or web address] for Invokana.

## 2018-08-03 ENCOUNTER — Other Ambulatory Visit: Payer: PRIVATE HEALTH INSURANCE

## 2018-08-08 ENCOUNTER — Ambulatory Visit: Payer: PRIVATE HEALTH INSURANCE | Admitting: Endocrinology

## 2018-08-15 ENCOUNTER — Telehealth: Payer: Self-pay | Admitting: Endocrinology

## 2018-08-15 ENCOUNTER — Other Ambulatory Visit: Payer: Self-pay

## 2018-08-15 MED ORDER — METFORMIN HCL ER 500 MG PO TB24: 500.0000 mg | ORAL_TABLET | Freq: Every day | ORAL | 1 refills | Status: DC

## 2018-08-15 NOTE — Telephone Encounter (Signed)
Rx sent 

## 2018-08-15 NOTE — Telephone Encounter (Signed)
MEDICATION: Metformin 500 MG  PHARMACY:  [facility] on [address]  IS THIS A 90 DAY SUPPLY :  unknown  IS PATIENT OUT OF MEDICATION: no  IF NOT; HOW MUCH IS LEFT: 1-2 days  LAST APPOINTMENT DATE: @2 /17/2020  NEXT APPOINTMENT DATE:@4 /17/2020  DO WE HAVE YOUR PERMISSION TO LEAVE A DETAILED MESSAGE: YES  OTHER COMMENTS:    **Let patient know to contact pharmacy at the end of the day to make sure medication is ready. **  ** Please notify patient to allow 48-72 hours to process**  **Encourage patient to contact the pharmacy for refills or they can request refills through [facility]**

## 2018-08-22 ENCOUNTER — Telehealth: Payer: Self-pay

## 2018-08-22 NOTE — Telephone Encounter (Signed)
PA submitted via [email or web address] for Invokana 100mg  tablets once daily.  Key: [id number]

## 2018-08-26 ENCOUNTER — Telehealth: Payer: Self-pay | Admitting: Endocrinology

## 2018-08-26 NOTE — Telephone Encounter (Signed)
error 

## 2018-08-31 ENCOUNTER — Other Ambulatory Visit: Payer: Self-pay | Admitting: Endocrinology

## 2018-08-31 MED ORDER — ERTUGLIFLOZIN L-PYROGLUTAMICAC 5 MG PO TABS: 5.0000 mg | ORAL_TABLET | Freq: Every day | ORAL | 1 refills | Status: DC

## 2018-08-31 NOTE — Progress Notes (Unsigned)
Steglatro 5 mg prescription sent instead of Invokana which was denied by insurance.  Patient needs to be notified  Miranda Stevens

## 2018-09-01 ENCOUNTER — Telehealth: Payer: Self-pay | Admitting: Endocrinology

## 2018-09-01 NOTE — Progress Notes (Signed)
Called pt and left detailed voicemail with MD message. 

## 2018-09-01 NOTE — Telephone Encounter (Signed)
Called pt back and informed her of the voicemail that was previously left about change from Invokana to [facility] 5mg  Pt verbalized understanding.

## 2018-09-01 NOTE — Telephone Encounter (Signed)
Patient stated that she received a phone call from our office. And is not sure what it is in regards to

## 2018-09-08 ENCOUNTER — Ambulatory Visit: Payer: PRIVATE HEALTH INSURANCE

## 2018-09-16 ENCOUNTER — Other Ambulatory Visit: Payer: PRIVATE HEALTH INSURANCE

## 2018-09-20 ENCOUNTER — Ambulatory Visit: Payer: PRIVATE HEALTH INSURANCE | Admitting: Endocrinology

## 2018-10-25 ENCOUNTER — Other Ambulatory Visit: Payer: Self-pay

## 2018-10-25 ENCOUNTER — Other Ambulatory Visit (INDEPENDENT_AMBULATORY_CARE_PROVIDER_SITE_OTHER): Payer: PRIVATE HEALTH INSURANCE

## 2018-10-25 DIAGNOSIS — E1165 Type 2 diabetes mellitus with hyperglycemia: Secondary | ICD-10-CM

## 2018-10-25 LAB — BASIC METABOLIC PANEL
BUN: 10 mg/dL (ref 6–23)
CO2: 26 mEq/L (ref 19–32)
Calcium: 9.8 mg/dL (ref 8.4–10.5)
Chloride: 102 mEq/L (ref 96–112)
Creatinine, Ser: 0.9 mg/dL (ref 0.40–1.20)
GFR: 85.1 mL/min (ref >60.00)
Glucose, Bld: 138 mg/dL — ABNORMAL HIGH (ref 70–99)
Potassium: 3.9 mEq/L (ref 3.5–5.1)
Sodium: 138 mEq/L (ref 135–145)

## 2018-10-26 LAB — FRUCTOSAMINE: Fructosamine: 388 umol/L — ABNORMAL HIGH (ref 0–285)

## 2018-10-28 ENCOUNTER — Ambulatory Visit (INDEPENDENT_AMBULATORY_CARE_PROVIDER_SITE_OTHER): Payer: PRIVATE HEALTH INSURANCE | Admitting: Endocrinology

## 2018-10-28 ENCOUNTER — Other Ambulatory Visit: Payer: Self-pay

## 2018-10-28 DIAGNOSIS — E1165 Type 2 diabetes mellitus with hyperglycemia: Secondary | ICD-10-CM

## 2018-10-28 DIAGNOSIS — Z794 Long term (current) use of insulin: Secondary | ICD-10-CM

## 2018-10-28 MED ORDER — METFORMIN HCL ER 500 MG PO TB24: ORAL_TABLET | ORAL | 0 refills | Status: DC

## 2018-10-28 MED ORDER — STEGLATRO 15 MG PO TABS: 15.0000 mg | ORAL_TABLET | Freq: Every day | ORAL | 1 refills | Status: DC

## 2018-10-28 NOTE — Progress Notes (Signed)
Patient ID: Miranda Stevens, female   DOB: 12-25-1980, 38 y.o.   MRN: [id number]           Today's office visit was provided via telemedicine using video technique Explained to the patient and the the limitations of evaluation and management by telemedicine and the availability of in person appointments.  The patient understood the limitations and agreed to proceed. Patient also understood that the telehealth visit is billable.  Location of the patient: Home  Location of the provider: Office Only the patient and myself were participating in the encounter    Reason for Appointment: Follow-up for Type 2 Diabetes  Referring physician: Abbe Amsterdam   History of Present Illness:          Date of diagnosis of type 2 diabetes mellitus: 2013       Background history:  She was diagnosed to have diabetes as part of her evaluation for PCOS with a glucose tolerance test She was then put on metformin which she has continued Most of her diabetes care has been in [place] and no records are available Because of her sugars going up last year Amaryl was added to her and metformin continued unchanged Not clear what her A1c was at that time  Recent history:   Most recent A1c was 11.7, previously was 7.4 Fructosamine now is 388, previously 446  INSULIN regimen is: LANTUS 18 units, NovoLog 6 units before meals       Non-insulin hypoglycemic drugs the patient is taking are: Metformin ER 0.5 g, Steglatro 5 mg daily  Current management, blood sugar patterns and problems identified:  She was go up to 30 units on her Lantus insulin on her last visit because of her fasting readings being as high as 300  However she did not know down her instructions and is still taking the same dose  Also did not follow-up in 6 weeks as directed  Also is very sporadic and checking her blood sugars with only a few readings in the last 5 days  She still has relatively high fasting readings  With taking  NovoLog her mealtime hyperglycemia may be slightly better with at least 1 reading below 200  She was also told to start [facility] which she has done  She does not think she has any yeast infection, does notice some increased urination but no UTIs  She was supposed to increase her metformin ER progressively but she thinks that because she only got limited number of tablets she did not go up be on 500 mg  Amaryl was stopped  She thinks she may have lost 3 or 4 pounds since her last visit  She does try to walk up to 1 hour several days a week now        Side effects from medications have been: Diarrhea from regular metformin  Compliance with the medical regimen: Inconsistent  Glucose monitoring:  done generally 1 times a day         Glucometer:  [facility] brand       Blood Glucose readings by patient reading off her numbers  Still has not checked on the preferred brand of her glucose meter from insurance  PRE-MEAL Fasting Lunch Dinner Bedtime Overall  Glucose range:  186-255      Mean/median:      202   POST-MEAL PC Breakfast PC Lunch PC Dinner  Glucose range:   144  207  Mean/median:      :   PRE-MEAL Fasting  Lunch Dinner Bedtime Overall  Glucose range: 255-300 250  250   Mean/median:          Dietician visit, most recent: 02/24/2018  Weight history:  Wt Readings from Last 3 Encounters:  07/08/18 (!) 317 lb 9.6 oz (144.1 kg)  06/26/18 (!) 317 lb (143.8 kg)  06/22/18 (!) 317 lb (143.8 kg)    Glycemic control:   Lab Results  Component Value Date   HGBA1C 11.7 (A) 07/08/2018   HGBA1C 7.4 (A) 02/24/2018   HGBA1C 12.6 (A) 11/01/2017   Lab Results  Component Value Date   MICROALBUR 6.4 (H) 07/05/2018   LDLCALC 95 03/03/2018   CREATININE 0.90 10/25/2018   Lab Results  Component Value Date   MICRALBCREAT 6.5 07/05/2018    Lab Results  Component Value Date   FRUCTOSAMINE 388 (H) 10/25/2018   FRUCTOSAMINE 446 (H) 07/05/2018   FRUCTOSAMINE 379 (H) 04/22/2018       Allergies as of 10/28/2018      Reactions   Aspirin Other (See Comments)   NO aspirin      Medication List       Accurate as of Oct 28, 2018 11:59 PM. If you have any questions, ask your nurse or doctor.        STOP taking these medications   Ertugliflozin L-PyroglutamicAc 5 MG Tabs Commonly known as:  Steglatro Replaced by:  Steglatro 15 MG Tabs Stopped by:  Reather Littler, MD   glimepiride 4 MG tablet Commonly known as:  AMARYL Stopped by:  Reather Littler, MD   Insulin Glulisine 100 UNIT/ML Solostar Pen Commonly known as:  Risk manager Stopped by:  Reather Littler, MD     TAKE these medications   acetaminophen 500 MG tablet Commonly known as:  TYLENOL Take 1,000 mg by mouth every 6 (six) hours as needed for mild pain, moderate pain or headache. Pain   albuterol 108 (90 Base) MCG/ACT inhaler Commonly known as:  VENTOLIN HFA Inhale 1-2 puffs into the lungs every 6 (six) hours as needed for wheezing or shortness of breath.   atorvastatin 10 MG tablet Commonly known as:  LIPITOR Take 1 tablet (10 mg total) by mouth daily.   CINNAMON PO Take 1 tablet by mouth daily.   insulin aspart 100 UNIT/ML FlexPen Commonly known as:  NovoLOG FlexPen INJECT 6 UNITS UNDER THE SKIN 3 TIMES DAILY BEFORE MEALS.   insulin glargine 100 UNIT/ML injection Commonly known as:  LANTUS Inject 0.14 mLs (14 Units total) into the skin daily. INJECT 14 UNITS UNDER THE SKIN BEFORE BED. What changed:    how much to take  when to take this  additional instructions   magic mouthwash Soln Take 5 mLs by mouth 4 (four) times daily as needed for mouth pain.   medroxyPROGESTERone 150 MG/ML injection Commonly known as:  DEPO-PROVERA Inject 1 mL (150 mg total) into the muscle every 3 (three) months.   metFORMIN 500 MG 24 hr tablet Commonly known as:  GLUCOPHAGE-XR Start with 1 tablet twice a day, next week take 1 in the morning and 2 in the evening and the following week 2 tablets twice a  day What changed:    how much to take  how to take this  when to take this  additional instructions Changed by:  Reather Littler, MD   Pen Needles 32G X 6 MM Misc 1 each by Subcutaneous Infusion route as directed.   rizatriptan 10 MG tablet Commonly known as:  MAXALT Take 10 mg  by mouth as needed for migraine. May repeat in 2 hours if needed   Steglatro 15 MG Tabs Generic drug:  Ertugliflozin L-PyroglutamicAc Take 15 mg by mouth daily with breakfast. Replaces:  Ertugliflozin L-PyroglutamicAc 5 MG Tabs Started by:  Reather Littler, MD   triamcinolone cream 0.1 % Commonly known as:  KENALOG Apply topically 2 (two) times daily. Apply to affected area What changed:    how much to take  when to take this  reasons to take this  additional instructions       Allergies:  Allergies  Allergen Reactions   Aspirin Other (See Comments)    NO aspirin    Past Medical History:  Diagnosis Date   Asthma    Diabetes mellitus without complication (HCC)    Headache(784.0)    High cholesterol    Migraine    Migraine    Pericarditis    Polycystic disease, ovaries     Past Surgical History:  Procedure Laterality Date   BIOPSY ENDOMETRIAL      Family History  Problem Relation Age of Onset   Lung disease Mother    Diabetes Mother    Heart disease Mother    Hyperlipidemia Father     Social History:  reports that she quit smoking about 7 years ago. Her smoking use included cigarettes. She quit after 10.00 years of use. She has never used smokeless tobacco. She reports previous alcohol use. She reports previous drug use.   Review of Systems   Lipid history: On treatment with Lipitor for several months prescribed by PCP with results as follows    Lab Results  Component Value Date   CHOL 147 03/03/2018   HDL 31.20 (L) 03/03/2018   LDLCALC 95 03/03/2018   TRIG 103.0 03/03/2018   CHOLHDL 5 03/03/2018           Hypertension: Has not been treated for  this  BP Readings from Last 3 Encounters:  07/08/18 130/72  06/27/18 132/90  06/22/18 134/82    Most recent eye exam was in 2019  Most recent foot exam: 9/19  Currently known complications of diabetes: None  LABS:  Lab on 10/25/2018  Component Date Value Ref Range Status   Fructosamine 10/25/2018 388* 0 - 285 umol/L Final   Comment: Published reference interval for apparently healthy subjects between age 22 and 62 is 66 - 285 umol/L and in a poorly controlled diabetic population is 228 - 563 umol/L with a mean of 396 umol/L.    Sodium 10/25/2018 138  135 - 145 mEq/L Final   Potassium 10/25/2018 3.9  3.5 - 5.1 mEq/L Final   Chloride 10/25/2018 102  96 - 112 mEq/L Final   CO2 10/25/2018 26  19 - 32 mEq/L Final   Glucose, Bld 10/25/2018 138* 70 - 99 mg/dL Final   BUN 46/96/2952 10  6 - 23 mg/dL Final   Creatinine, Ser 10/25/2018 0.90  0.40 - 1.20 mg/dL Final   Calcium 84/13/2440 9.8  8.4 - 10.5 mg/dL Final   GFR 03/28/2535 85.10  >60.00 mL/min Final    Physical Examination:  There were no vitals taken for this visit.     ASSESSMENT:  Diabetes type 2 with morbid obesity  See history of present illness for detailed discussion of current diabetes management, blood sugar patterns and problems identified  Her A1c was last 11.7  With starting Steglatro her blood sugars appear to be relatively better as judged by her fructosamine of 388 However her morning blood  sugars are still averaging over 200 recently She has no side effects with this medication and no change in her renal function  Also with adding mealtime insulin at least her afternoon blood sugars are improved with readings as low as 138 after lunch in the lab She has not understood instructions for adjusting her basal insulin to get her morning sugars back to target Also still has difficulty losing weight despite improved lifestyle as above although not clear if she is consistently watching her  diet She is not understanding the actions of basal insulin and the need for adjustment despite giving her written instructions on the last visit  Will need A1c on the next visit  Lipids: Previously controlled with Lipitor, will need follow-up    Stevens:    Lantus 24 units and titrate weekly  She can check the coverage for her insurance for the preferred brand of glucose monitor  Today have given her detailed instructions through a MyChart message as below  Needs more regular follow-up  Increase metformin to 500 mg twice daily for 1 week and then 2000 mg a day or maximum tolerated dose  New prescription has been sent  Increase Steglatro to 15 mg, this may improve her control as well as potentially more weight loss, new prescription sent  Consider follow-up consultation with dietitian   Counseling time on subjects discussed in assessment and Stevens sections is over 50% of today's 25 minute visit   There are no Patient Instructions on file for this visit.  1. Glucose monitoring: Please check with your insurance to see what brand of meter they cover  2. Check blood sugars at least once a day about 2 hours after a meal  3. Increase Lantus to 24 units and after every 5 to 6 days go up 2 units until the morning sugar is below 130  4. Increase metformin to twice a day and after 1 week take 2 in the evening and 1 in the morning. The following week go up to 2 tablets twice a day if no stomach discomfort or diarrhea  5. Increase Steglatro to 15 mg daily instead of 5   6. Please call to follow-up in 6 weeks   7. If your blood sugars after meals are still more than 180 increase the NovoLog to 8 units for that particular meal     Reather LittlerAjay Adren Dollins 10/29/2018, 2:20 PM   Note: This office note was prepared with Dragon voice recognition system technology. Any transcriptional errors that result from this process are unintentional.

## 2018-10-29 ENCOUNTER — Encounter: Payer: Self-pay | Admitting: Endocrinology

## 2018-11-29 ENCOUNTER — Encounter

## 2019-01-03 ENCOUNTER — Other Ambulatory Visit: Payer: PRIVATE HEALTH INSURANCE

## 2019-01-06 ENCOUNTER — Ambulatory Visit: Payer: PRIVATE HEALTH INSURANCE | Admitting: Endocrinology

## 2019-01-23 ENCOUNTER — Encounter: Payer: Self-pay | Admitting: Family Medicine

## 2019-01-23 ENCOUNTER — Other Ambulatory Visit: Payer: Self-pay

## 2019-01-23 ENCOUNTER — Telehealth (INDEPENDENT_AMBULATORY_CARE_PROVIDER_SITE_OTHER): Payer: PRIVATE HEALTH INSURANCE | Admitting: Family Medicine

## 2019-01-23 DIAGNOSIS — E1165 Type 2 diabetes mellitus with hyperglycemia: Secondary | ICD-10-CM

## 2019-01-23 DIAGNOSIS — IMO0001 Reserved for inherently not codable concepts without codable children: Secondary | ICD-10-CM

## 2019-01-23 DIAGNOSIS — G43809 Other migraine, not intractable, without status migrainosus: Secondary | ICD-10-CM

## 2019-01-23 DIAGNOSIS — E782 Mixed hyperlipidemia: Secondary | ICD-10-CM

## 2019-01-23 MED ORDER — RIZATRIPTAN BENZOATE 10 MG PO TABS: 10.0000 mg | ORAL_TABLET | ORAL | 11 refills | Status: DC | PRN

## 2019-01-23 MED ORDER — ATORVASTATIN CALCIUM 10 MG PO TABS: 10.0000 mg | ORAL_TABLET | Freq: Every day | ORAL | 3 refills | Status: DC

## 2019-01-23 NOTE — Progress Notes (Signed)
Virtual Visit via Video Note  I connected with Glee Arvin on 01/23/19 at 10:30 AM EDT by a video enabled telemedicine application 2/2 VFIEP-32 pandemic and verified that I am speaking with the correct person using two identifiers.  Location patient: home Location provider:work or home office Persons participating in the virtual visit: patient, provider  I discussed the limitations of evaluation and management by telemedicine and the availability of in person appointments. The patient expressed understanding and agreed to proceed.   HPI: Pt needs a refill on rizatriptan.  Having 2 migraines/month.  Pt drinking more water.  Seeing Dr. Dwyane Dee for DM.  States bs varies.  Was 230 this am.     ROS: See pertinent positives and negatives per HPI.  Past Medical History:  Diagnosis Date  . Asthma   . Diabetes mellitus without complication ([place])   . Headache(784.0)   . High cholesterol   . Migraine   . Migraine   . Pericarditis   . Polycystic disease, ovaries     Past Surgical History:  Procedure Laterality Date  . BIOPSY ENDOMETRIAL      Family History  Problem Relation Age of Onset  . Lung disease Mother   . Diabetes Mother   . Heart disease Mother   . Hyperlipidemia Father      Current Outpatient Medications:  .  acetaminophen (TYLENOL) 500 MG tablet, Take 1,000 mg by mouth every 6 (six) hours as needed for mild pain, moderate pain or headache. Pain  , Disp: , Rfl:  .  albuterol (PROVENTIL HFA;VENTOLIN HFA) 108 (90 Base) MCG/ACT inhaler, Inhale 1-2 puffs into the lungs every 6 (six) hours as needed for wheezing or shortness of breath., Disp: , Rfl:  .  atorvastatin (LIPITOR) 10 MG tablet, Take 1 tablet (10 mg total) by mouth daily., Disp: 30 tablet, Rfl: 2 .  CINNAMON PO, Take 1 tablet by mouth daily. , Disp: , Rfl:  .  insulin aspart (NOVOLOG FLEXPEN) 100 UNIT/ML FlexPen, INJECT 6 UNITS UNDER THE SKIN 3 TIMES DAILY BEFORE MEALS., Disp: 15 mL, Rfl: 3 .  insulin glargine  (LANTUS) 100 UNIT/ML injection, Inject 0.14 mLs (14 Units total) into the skin daily. INJECT 14 UNITS UNDER THE SKIN BEFORE BED. (Patient taking differently: Inject 18 Units into the skin at bedtime. INJECT 18 UNITS UNDER THE SKIN AT NIGHT.), Disp: 15 mL, Rfl: 4 .  Insulin Pen Needle (PEN NEEDLES) 32G X 6 MM MISC, 1 each by Subcutaneous Infusion route as directed., Disp: 100 each, Rfl: 11 .  magic mouthwash SOLN, Take 5 mLs by mouth 4 (four) times daily as needed for mouth pain., Disp: 120 mL, Rfl: 1 .  medroxyPROGESTERone (DEPO-PROVERA) 150 MG/ML injection, Inject 1 mL (150 mg total) into the muscle every 3 (three) months., Disp: 1 mL, Rfl: 4 .  metFORMIN (GLUCOPHAGE-XR) 500 MG 24 hr tablet, Start with 1 tablet twice a day, next week take 1 in the morning and 2 in the evening and the following week 2 tablets twice a day, Disp: 360 tablet, Rfl: 0 .  rizatriptan (MAXALT) 10 MG tablet, Take 10 mg by mouth as needed for migraine. May repeat in 2 hours if needed, Disp: , Rfl:  .  STEGLATRO 15 MG TABS, Take 15 mg by mouth daily with breakfast., Disp: 60 tablet, Rfl: 1 .  triamcinolone cream (KENALOG) 0.1 %, Apply topically 2 (two) times daily. Apply to affected area (Patient taking differently: Apply 1 application topically 2 (two) times daily as needed (eczema). ),  Disp: 30 g, Rfl: 0  EXAM:  VITALS per patient if applicable:  GENERAL: alert, oriented, appears well and in no acute distress  HEENT: atraumatic, conjunctiva clear, no obvious abnormalities on inspection of external nose and ears  NECK: normal movements of the head and neck  LUNGS: on inspection no signs of respiratory distress, breathing rate appears normal, no obvious gross SOB, gasping or wheezing  CV: no obvious cyanosis  MS: moves all visible extremities without noticeable abnormality  PSYCH/NEURO: pleasant and cooperative, no obvious depression or anxiety, speech and thought processing grossly intact  ASSESSMENT AND  PLAN:  Discussed the following assessment and plan:  Other migraine without status migrainosus, not intractable -continue staying hydrated.  Limit caffeine intake, get plenty of rest, and reduce stress.  - Plan: rizatriptan (MAXALT) 10 MG tablet  Mixed hyperlipidemia  - Plan: atorvastatin (LIPITOR) 10 MG tablet  Uncontrolled diabetes mellitus type 2 without complications (HCC) -hgb A1C 11.7% on 07/2018 -continue lifestyle modifications -continue checking fsbs  -continue current medicaitons -continue f/u with Endo, Dr. Lucianne MussKumar.  F/u prn in the next few wks for CPE   I discussed the assessment and treatment plan with the patient. The patient was provided an opportunity to ask questions and all were answered. The patient agreed with the plan and demonstrated an understanding of the instructions.   The patient was advised to call back or seek an in-person evaluation if the symptoms worsen or if the condition fails to improve as anticipated.   Deeann SaintShannon R Neil Brickell, MD

## 2019-02-14 ENCOUNTER — Other Ambulatory Visit: Payer: PRIVATE HEALTH INSURANCE

## 2019-02-21 ENCOUNTER — Ambulatory Visit: Payer: PRIVATE HEALTH INSURANCE | Admitting: Endocrinology

## 2019-03-06 ENCOUNTER — Encounter: Payer: Self-pay | Admitting: Family Medicine

## 2019-03-06 ENCOUNTER — Ambulatory Visit (INDEPENDENT_AMBULATORY_CARE_PROVIDER_SITE_OTHER): Payer: PRIVATE HEALTH INSURANCE | Admitting: Family Medicine

## 2019-03-06 ENCOUNTER — Other Ambulatory Visit: Payer: Self-pay

## 2019-03-06 VITALS — BP 124/74 | HR 104 | Ht 71.0 in | Wt 316.7 lb

## 2019-03-06 DIAGNOSIS — B379 Candidiasis, unspecified: Secondary | ICD-10-CM

## 2019-03-06 DIAGNOSIS — Z113 Encounter for screening for infections with a predominantly sexual mode of transmission: Secondary | ICD-10-CM | POA: Diagnosis not present

## 2019-03-06 DIAGNOSIS — B373 Candidiasis of vulva and vagina: Secondary | ICD-10-CM | POA: Diagnosis not present

## 2019-03-06 DIAGNOSIS — R350 Frequency of micturition: Secondary | ICD-10-CM

## 2019-03-06 DIAGNOSIS — N898 Other specified noninflammatory disorders of vagina: Secondary | ICD-10-CM

## 2019-03-06 LAB — POCT URINALYSIS DIP (DEVICE)
Bilirubin Urine: NEGATIVE
Glucose, UA: 500 mg/dL — AB
Ketones, ur: NEGATIVE mg/dL
Nitrite: NEGATIVE
Protein, ur: 30 mg/dL — AB
Specific Gravity, Urine: 1.02 (ref 1.005–1.030)
Urobilinogen, UA: 0.2 mg/dL (ref 0.0–1.0)
pH: 5 (ref 5.0–8.0)

## 2019-03-06 NOTE — Progress Notes (Signed)
GYNECOLOGY OFFICE VISIT NOTE  History:   Miranda Stevens is a 38 y.o. G0P0 here today for vaginal itching and burning for the last several weeks. Also notes urinary frequency. Patient reports symptoms for past 9 days. About 10-11 ago, patient reports sexual activity with new female partner. She had previously not had intercourse for 3 years. Discharge is brown in color. Denies odor. Reports that her vagina feels swollen and a little painful. Patient denies dysuria and reports she always has urinary frequency due to diabetes. Denies fever, chills, nausea, vomiting, abdominal pain or other concerns.    Past Medical History:  Diagnosis Date  . Asthma   . Diabetes mellitus without complication (HCC)   . Headache(784.0)   . High cholesterol   . Migraine   . Migraine   . Pericarditis   . Polycystic disease, ovaries     Past Surgical History:  Procedure Laterality Date  . BIOPSY ENDOMETRIAL      The following portions of the patient's history were reviewed and updated as appropriate: allergies, current medications, past family history, past medical history, past social history, past surgical history and problem list.   Health Maintenance:  Normal pap and NILM June 2019.  Review of Systems:  Pertinent items noted in HPI and remainder of comprehensive ROS otherwise negative.  Physical Exam:  BP 124/74   Pulse (!) 104   Ht 5\' 11"  (1.803 m)   Wt (!) 316 lb 11.2 oz (143.7 kg)   BMI 44.17 kg/m  CONSTITUTIONAL: Well-developed, well-nourished female in no acute distress. Obese.  HEENT:  Normocephalic, atraumatic. External right and left ear normal. No scleral icterus.  NECK: Normal range of motion, supple, no masses noted on observation SKIN: No rash noted. Not diaphoretic. No erythema. No pallor. MUSCULOSKELETAL: Normal range of motion. No edema noted. NEUROLOGIC: Alert and oriented to person, place, and time. Normal muscle tone coordination. No cranial nerve deficit noted.  PSYCHIATRIC: Normal mood and affect. Normal behavior. Normal judgment and thought content. CARDIOVASCULAR: Normal heart rate noted RESPIRATORY: Effort and breath sounds normal, no problems with respiration noted ABDOMEN: No masses noted. No other overt distention noted.   PELVIC: Normally appearing external genitalia. Labia minora and vaginal mucosa erythematous and tender to touch. No cervical abnormalities noted. Some white discharge noted.  Labs and Imaging Results for orders placed or performed in visit on 03/06/19 (from the past 168 hour(s))  POCT urinalysis dip (device)   Collection Time: 03/06/19  6:15 PM  Result Value Ref Range   Glucose, UA 500 (A) NEGATIVE mg/dL   Bilirubin Urine NEGATIVE NEGATIVE   Ketones, ur NEGATIVE NEGATIVE mg/dL   Specific Gravity, Urine 1.020 1.005 - 1.030   Hgb urine dipstick LARGE (A) NEGATIVE   pH 5.0 5.0 - 8.0   Protein, ur 30 (A) NEGATIVE mg/dL   Urobilinogen, UA 0.2 0.0 - 1.0 mg/dL   Nitrite NEGATIVE NEGATIVE   Leukocytes,Ua LARGE (A) NEGATIVE   No results found.    Assessment and Plan:  Miranda Stevens was seen today for gynecologic exam.  Diagnoses and all orders for this visit:  Vaginal discharge -     Cervicovaginal ancillary only( [place]) - GC and wet prep obtained - Recent new sexual activity  - Await results. Could consider topical cream if workup unremarkable although unclear etiology for erythematous and pruritic vaginal tissue   Urinary frequency -     Urine Culture -     POCT urinalysis dip (device) - Large leuks on UA;  will send for cx  Routine preventative health maintenance measures emphasized. Please refer to After Visit Summary for other counseling recommendations.   No follow-ups on file.    Total face-to-face time with patient: 15 minutes.  Over 50% of encounter was spent on counseling and coordination of care.   Barrington Ellison, MD [facility] Fellow, [facility] for [employer], [place]

## 2019-03-06 NOTE — Patient Instructions (Signed)
Vaginitis Vaginitis is a condition in which the vaginal tissue swells and becomes red (inflamed). This condition is most often caused by a change in the normal balance of bacteria and yeast that live in the vagina. This change causes an overgrowth of certain bacteria or yeast, which causes the inflammation. There are different types of vaginitis, but the most common types are:  Bacterial vaginosis.  Yeast infection (candidiasis).  Trichomoniasis vaginitis. This is a sexually transmitted disease (STD).  Viral vaginitis.  Atrophic vaginitis.  Allergic vaginitis. What are the causes? The cause of this condition depends on the type of vaginitis. It can be caused by:  Bacteria (bacterial vaginosis).  Yeast, which is a fungus (yeast infection).  A parasite (trichomoniasis vaginitis).  A virus (viral vaginitis).  Low hormone levels (atrophic vaginitis). Low hormone levels can occur during pregnancy, breastfeeding, or after menopause.  Irritants, such as bubble baths, scented tampons, and feminine sprays (allergic vaginitis). Other factors can change the normal balance of the yeast and bacteria that live in the vagina. These include:  Antibiotic medicines.  Poor hygiene.  Diaphragms, vaginal sponges, spermicides, birth control pills, and intrauterine devices (IUD).  Sex.  Infection.  Uncontrolled diabetes.  A weakened defense (immune) system. What increases the risk? This condition is more likely to develop in women who:  Smoke.  Use vaginal douches, scented tampons, or scented sanitary pads.  Wear tight-fitting pants.  Wear thong underwear.  Use oral birth control pills or an IUD.  Have sex without a condom.  Have multiple sex partners.  Have an STD.  Frequently use the spermicide nonoxynol-9.  Eat lots of foods high in sugar.  Have uncontrolled diabetes.  Have low estrogen levels.  Have a weakened immune system from an immune disorder or medical  treatment.  Are pregnant or breastfeeding. What are the signs or symptoms? Symptoms vary depending on the cause of the vaginitis. Common symptoms include:  Abnormal vaginal discharge. ? The discharge is white, gray, or yellow with bacterial vaginosis. ? The discharge is thick, white, and cheesy with a yeast infection. ? The discharge is frothy and yellow or greenish with trichomoniasis.  A bad vaginal smell. The smell is fishy with bacterial vaginosis.  Vaginal itching, pain, or swelling.  Sex that is painful.  Pain or burning when urinating. Sometimes there are no symptoms. How is this diagnosed? This condition is diagnosed based on your symptoms and medical history. A physical exam, including a pelvic exam, will also be done. You may also have other tests, including:  Tests to determine the pH level (acidity or alkalinity) of your vagina.  A whiff test, to assess the odor that results when a sample of your vaginal discharge is mixed with a potassium hydroxide solution.  Tests of vaginal fluid. A sample will be examined under a microscope. How is this treated? Treatment varies depending on the type of vaginitis you have. Your treatment may include:  Antibiotic creams or pills to treat bacterial vaginosis and trichomoniasis.  Antifungal medicines, such as vaginal creams or suppositories, to treat a yeast infection.  Medicine to ease discomfort if you have viral vaginitis. Your sexual partner should also be treated.  Estrogen delivered in a cream, pill, suppository, or vaginal ring to treat atrophic vaginitis. If vaginal dryness occurs, lubricants and moisturizing creams may help. You may need to avoid scented soaps, sprays, or douches.  Stopping use of a product that is causing allergic vaginitis. Then using a vaginal cream to treat the symptoms. Follow   these instructions at home: Lifestyle  Keep your genital area clean and dry. Avoid soap, and only rinse the area with  water.  Do not douche or use tampons until your health care provider says it is okay to do so. Use sanitary pads, if needed.  Do not have sex until your health care provider approves. When you can return to sex, practice safe sex and use condoms.  Wipe from front to back. This avoids the spread of bacteria from the rectum to the vagina. General instructions  Take over-the-counter and prescription medicines only as told by your health care provider.  If you were prescribed an antibiotic medicine, take or use it as told by your health care provider. Do not stop taking or using the antibiotic even if you start to feel better.  Keep all follow-up visits as told by your health care provider. This is important. How is this prevented?  Use mild, non-scented products. Do not use things that can irritate the vagina, such as fabric softeners. Avoid the following products if they are scented: ? Feminine sprays. ? Detergents. ? Tampons. ? Feminine hygiene products. ? Soaps or bubble baths.  Let air reach your genital area. ? Wear cotton underwear to reduce moisture buildup. ? Avoid wearing underwear while you sleep. ? Avoid wearing tight pants and underwear or nylons without a cotton panel. ? Avoid wearing thong underwear.  Take off any wet clothing, such as bathing suits, as soon as possible.  Practice safe sex and use condoms. Contact a health care provider if:  You have abdominal pain.  You have a fever.  You have symptoms that last for more than 2-3 days. Get help right away if:  You have a fever and your symptoms suddenly get worse. Summary  Vaginitis is a condition in which the vaginal tissue becomes inflamed.This condition is most often caused by a change in the normal balance of bacteria and yeast that live in the vagina.  Treatment varies depending on the type of vaginitis you have.  Do not douche, use tampons , or have sex until your health care provider approves. When  you can return to sex, practice safe sex and use condoms. This information is not intended to replace advice given to you by your health care provider. Make sure you discuss any questions you have with your health care provider. Document Released: 03/15/2007 Document Revised: 04/30/2017 Document Reviewed: 06/23/2016 Elsevier Patient Education  2020 Elsevier Inc.  

## 2019-03-07 ENCOUNTER — Telehealth (INDEPENDENT_AMBULATORY_CARE_PROVIDER_SITE_OTHER): Payer: PRIVATE HEALTH INSURANCE

## 2019-03-07 DIAGNOSIS — N898 Other specified noninflammatory disorders of vagina: Secondary | ICD-10-CM

## 2019-03-07 NOTE — Telephone Encounter (Signed)
Pt called and left message on nurse VM requesting a call back.

## 2019-03-08 DIAGNOSIS — B379 Candidiasis, unspecified: Secondary | ICD-10-CM

## 2019-03-08 LAB — URINE CULTURE

## 2019-03-08 MED ORDER — AMOXICILLIN 875 MG PO TABS: 875.0000 mg | ORAL_TABLET | Freq: Two times a day (BID) | ORAL | 0 refills | Status: AC

## 2019-03-08 NOTE — Telephone Encounter (Signed)
Called pt in response to VM message. Pt would like to know if we have results from visit on 10/5. Reviewed urine culture results with pt. Informed pt her other results are not back. Explained to pt that I will speak with a provider about her urine culture results and send her a MyChart message with an update. Pt agreeable to plan.

## 2019-03-08 NOTE — Addendum Note (Signed)
Addended by: Barrington Ellison on: 03/08/2019 10:13 AM   Modules accepted: Orders

## 2019-03-13 LAB — CERVICOVAGINAL ANCILLARY ONLY
Bacterial Vaginitis (gardnerella): NEGATIVE
Candida Glabrata: NEGATIVE
Candida Vaginitis: POSITIVE — AB
Chlamydia: NEGATIVE
Comment: NEGATIVE
Comment: NEGATIVE
Comment: NEGATIVE
Comment: NEGATIVE
Neisseria Gonorrhea: NEGATIVE
Trichomonas: NEGATIVE

## 2019-03-13 MED ORDER — FLUCONAZOLE 150 MG PO TABS: 150.0000 mg | ORAL_TABLET | Freq: Once | ORAL | 1 refills | Status: AC

## 2019-03-14 MED ORDER — FLUCONAZOLE 150 MG PO TABS: 150.0000 mg | ORAL_TABLET | Freq: Once | ORAL | 0 refills | Status: AC

## 2019-03-14 NOTE — Addendum Note (Signed)
Addended by: Barrington Ellison on: 03/14/2019 07:54 PM   Modules accepted: Orders

## 2019-03-27 ENCOUNTER — Telehealth: Payer: Self-pay | Admitting: *Deleted

## 2019-03-27 DIAGNOSIS — B379 Candidiasis, unspecified: Secondary | ICD-10-CM

## 2019-03-27 DIAGNOSIS — N898 Other specified noninflammatory disorders of vagina: Secondary | ICD-10-CM

## 2019-03-27 NOTE — Telephone Encounter (Signed)
Miranda Stevens left a voice message this afternoon that she recently visited [place] and had a yeast infection. States she is still having a little bit of trouble with that and wants to know what to do next- whether she can get a refill prescription. Jacques Navy

## 2019-03-28 MED ORDER — FLUCONAZOLE 150 MG PO TABS: 150.0000 mg | ORAL_TABLET | Freq: Once | ORAL | 0 refills | Status: AC

## 2019-03-28 NOTE — Telephone Encounter (Signed)
I called Miranda Stevens and she reports she took the diflucan twice before and also took antibiotics. States it was getting better ; but started having vaginal itching and perineal burning again- not as bad- but wants to get it taken care of before gets worse. I explained I can send in one diflucan per protocol and if that does not help; to let us know. We also discussed since she is diabetic she is more at risk to get yeast infections. We discussed she can take probiotics- sometimes that helps; wear cotton underwear and keep her blood sugars under control. She voices understanding. Coulson Wehner,RN

## 2019-04-04 ENCOUNTER — Other Ambulatory Visit: Payer: Self-pay | Admitting: Endocrinology

## 2019-04-10 ENCOUNTER — Ambulatory Visit (INDEPENDENT_AMBULATORY_CARE_PROVIDER_SITE_OTHER): Payer: PRIVATE HEALTH INSURANCE

## 2019-04-10 ENCOUNTER — Other Ambulatory Visit: Payer: Self-pay

## 2019-04-10 DIAGNOSIS — Z3042 Encounter for surveillance of injectable contraceptive: Secondary | ICD-10-CM | POA: Diagnosis not present

## 2019-04-10 MED ORDER — MEDROXYPROGESTERONE ACETATE 150 MG/ML IM SUSP
150.0000 mg | Freq: Once | INTRAMUSCULAR | Status: AC
  Administered 2019-04-10: 19:00:00 150 mg via INTRAMUSCULAR

## 2019-04-10 NOTE — Progress Notes (Signed)
I have reviewed case with RN and agree with plan.

## 2019-04-10 NOTE — Progress Notes (Signed)
Pt here today to restart Depo Provera.  Pt reports that her last Depo Provera was in January 2020 and due to [place] she just has not come in.  Pt reports that her last intercourse was about a month ago.  She is currently on her period that started on 04/03/19.  Pregnancy test resulted negative.  Notified Dione Plover, MD, who recommended that pt can receive Depo Provera today.    Ceclia Ovid Curd here for Depo-Provera  Injection.  Injection administered without complication. Patient will return in 3 months for next injection.  Verdell Carmine, RN 04/10/2019  6:36 PM

## 2019-04-11 LAB — POCT PREGNANCY, URINE: Preg Test, Ur: NEGATIVE

## 2019-06-27 ENCOUNTER — Other Ambulatory Visit: Payer: Self-pay | Admitting: Endocrinology

## 2019-06-29 ENCOUNTER — Other Ambulatory Visit: Payer: Self-pay

## 2019-06-29 MED ORDER — METFORMIN HCL ER 500 MG PO TB24: ORAL_TABLET | ORAL | 0 refills | Status: DC

## 2019-07-06 ENCOUNTER — Ambulatory Visit (INDEPENDENT_AMBULATORY_CARE_PROVIDER_SITE_OTHER): Payer: 59

## 2019-07-06 ENCOUNTER — Other Ambulatory Visit: Payer: Self-pay

## 2019-07-06 ENCOUNTER — Other Ambulatory Visit (INDEPENDENT_AMBULATORY_CARE_PROVIDER_SITE_OTHER): Payer: 59

## 2019-07-06 VITALS — BP 117/74 | HR 89 | Wt 309.7 lb

## 2019-07-06 DIAGNOSIS — Z3042 Encounter for surveillance of injectable contraceptive: Secondary | ICD-10-CM | POA: Diagnosis not present

## 2019-07-06 DIAGNOSIS — E1165 Type 2 diabetes mellitus with hyperglycemia: Secondary | ICD-10-CM

## 2019-07-06 DIAGNOSIS — Z794 Long term (current) use of insulin: Secondary | ICD-10-CM | POA: Diagnosis not present

## 2019-07-06 LAB — LDL CHOLESTEROL, DIRECT: Direct LDL: 149 mg/dL

## 2019-07-06 LAB — COMPREHENSIVE METABOLIC PANEL
ALT: 25 U/L (ref 0–35)
AST: 16 U/L (ref 0–37)
Albumin: 4.2 g/dL (ref 3.5–5.2)
Alkaline Phosphatase: 82 U/L (ref 39–117)
BUN: 14 mg/dL (ref 6–23)
CO2: 25 mEq/L (ref 19–32)
Calcium: 9.3 mg/dL (ref 8.4–10.5)
Chloride: 103 mEq/L (ref 96–112)
Creatinine, Ser: 1.06 mg/dL (ref 0.40–1.20)
GFR: 70.2 mL/min (ref >60.00)
Glucose, Bld: 207 mg/dL — ABNORMAL HIGH (ref 70–99)
Potassium: 4.1 mEq/L (ref 3.5–5.1)
Sodium: 136 mEq/L (ref 135–145)
Total Bilirubin: 0.3 mg/dL (ref 0.2–1.2)
Total Protein: 7.5 g/dL (ref 6.0–8.3)

## 2019-07-06 LAB — HEMOGLOBIN A1C: Hgb A1c MFr Bld: 8.8 % — ABNORMAL HIGH (ref 4.6–6.5)

## 2019-07-06 LAB — LIPID PANEL
Cholesterol: 201 mg/dL — ABNORMAL HIGH (ref 0–200)
HDL: 25.2 mg/dL — ABNORMAL LOW (ref >39.00)
NonHDL: 175.31
Total CHOL/HDL Ratio: 8
Triglycerides: 209 mg/dL — ABNORMAL HIGH (ref 0.0–149.0)
VLDL: 41.8 mg/dL — ABNORMAL HIGH (ref 0.0–40.0)

## 2019-07-06 MED ORDER — MEDROXYPROGESTERONE ACETATE 150 MG/ML IM SUSP
150.0000 mg | Freq: Once | INTRAMUSCULAR | Status: AC
  Administered 2019-07-06: 14:00:00 150 mg via INTRAMUSCULAR

## 2019-07-06 NOTE — Progress Notes (Signed)
Miranda Stevens here for Depo-Provera  Injection.  Injection administered without complication. Patient will return in 3 months for next injection.  Ralene Bathe, RN 07/06/2019  1:55 PM

## 2019-07-07 MED ORDER — STEGLATRO 15 MG PO TABS: 1.0000 | ORAL_TABLET | Freq: Every day | ORAL | 2 refills | Status: DC

## 2019-07-08 NOTE — Progress Notes (Signed)
Chart reviewed for nurse visit. Agree with plan of care.   Duane Lope, NP 07/08/2019 10:54 AM

## 2019-07-10 ENCOUNTER — Telehealth: Payer: Self-pay

## 2019-07-10 NOTE — Telephone Encounter (Signed)
Noted  

## 2019-07-10 NOTE — Telephone Encounter (Signed)
Called pt and left voicemail informing her that the call was in regards to scheduling a follow up visit and requested a call back in order to do this.

## 2019-07-10 NOTE — Telephone Encounter (Signed)
Received fax from [email or web address] stating that a PA is needed for [facility]. Would you like to change medication or attempt PA?

## 2019-07-10 NOTE — Telephone Encounter (Signed)
She needs to first make her appointment, she had labs but no office visit

## 2019-07-20 NOTE — Telephone Encounter (Signed)
Patient is scheduled for 07/26/19

## 2019-07-24 ENCOUNTER — Ambulatory Visit: Payer: 59 | Attending: Family

## 2019-07-24 DIAGNOSIS — Z23 Encounter for immunization: Secondary | ICD-10-CM | POA: Insufficient documentation

## 2019-07-24 NOTE — Progress Notes (Signed)
   Covid-19 Vaccination Clinic  Name:  Zaydah Nawabi    MRN: [id number] DOB: July 25, 1980  07/24/2019  Ms. Baine was observed post Covid-19 immunization for 15 minutes without incidence. She was provided with Vaccine Information Sheet and instruction to access the V-Safe system.   Ms. Kinoshita was instructed to call 911 with any severe reactions post vaccine: Marland Kitchen Difficulty breathing  . Swelling of your face and throat  . A fast heartbeat  . A bad rash all over your body  . Dizziness and weakness    Immunizations Administered    Name Date Dose VIS Date Route   Moderna COVID-19 Vaccine 07/24/2019  2:10 PM 0.5 mL 05/02/2019 Intramuscular   Manufacturer: Moderna   Lot: [id number]   NDC: 99967-227-73

## 2019-07-25 NOTE — Progress Notes (Signed)
This encounter was created in error - please disregard.

## 2019-07-26 ENCOUNTER — Other Ambulatory Visit: Payer: Self-pay

## 2019-07-26 ENCOUNTER — Encounter: Payer: 59 | Admitting: Endocrinology

## 2019-08-29 ENCOUNTER — Ambulatory Visit: Payer: 59 | Attending: Family

## 2019-08-29 DIAGNOSIS — Z23 Encounter for immunization: Secondary | ICD-10-CM

## 2019-08-29 NOTE — Progress Notes (Signed)
   Covid-19 Vaccination Clinic  Name:  Miranda Stevens    MRN: [id number] DOB: 12-Jan-1981  08/29/2019  Miranda Stevens was observed post Covid-19 immunization for 15 minutes without incident. She was provided with Vaccine Information Sheet and instruction to access the V-Safe system.   Miranda Stevens was instructed to call 911 with any severe reactions post vaccine: Marland Kitchen Difficulty breathing  . Swelling of face and throat  . A fast heartbeat  . A bad rash all over body  . Dizziness and weakness   Immunizations Administered    Name Date Dose VIS Date Route   Moderna COVID-19 Vaccine 08/29/2019  1:53 PM 0.5 mL 05/02/2019 Intramuscular   Manufacturer: Moderna   Lot: [id number]   NDC: 43601-658-00

## 2019-09-15 ENCOUNTER — Telehealth: Payer: Self-pay

## 2019-09-15 NOTE — Telephone Encounter (Signed)
Called pt and gave her MD message. Pt verbalized understanding and appt was made for pt fu.

## 2019-09-15 NOTE — Telephone Encounter (Signed)
Pt called and stated that her blood sugar is 416, and she wanted to know if she should go to the ED. Pt denied headache, nausea, and vomiting. Pt stated that she had lost her meter and just found it yesterday and has only checked her sugar once since finding her meter again.  Pt is taking Metformin 500mg  tablets 2 tabets BID. Pt is not taking her Novolog or Lantus and reports that she did not want to take it without knowing what her blood sugar is.  Pt was made aware that she is overdue for a f/u visit and has not been seen in 11 months.  Pt was informed that I would ask Dr. to advise and call her back.

## 2019-09-15 NOTE — Telephone Encounter (Signed)
Please let her know that she needs to agree to be seen regularly and needs to make an appointment as soon as possible to be seen.  She will start back on her 18 units of Lantus once daily and NovoLog 6 units 3 times a day.  NovoLog to be increased by 2 units for every 100 points that blood sugar is over is 100

## 2019-09-17 ENCOUNTER — Emergency Department (HOSPITAL_COMMUNITY)
Admission: EM | Admit: 2019-09-17 | Discharge: 2019-09-17 | Disposition: A | Discharge status: Home / Self Care | Payer: 59 | Attending: Emergency Medicine | Admitting: Emergency Medicine

## 2019-09-17 ENCOUNTER — Encounter (HOSPITAL_COMMUNITY): Payer: Self-pay | Admitting: Emergency Medicine

## 2019-09-17 ENCOUNTER — Other Ambulatory Visit: Payer: Self-pay

## 2019-09-17 DIAGNOSIS — Z79899 Other long term (current) drug therapy: Secondary | ICD-10-CM | POA: Insufficient documentation

## 2019-09-17 DIAGNOSIS — E1065 Type 1 diabetes mellitus with hyperglycemia: Secondary | ICD-10-CM | POA: Diagnosis present

## 2019-09-17 DIAGNOSIS — B3731 Acute candidiasis of vulva and vagina: Secondary | ICD-10-CM

## 2019-09-17 DIAGNOSIS — Z87891 Personal history of nicotine dependence: Secondary | ICD-10-CM | POA: Insufficient documentation

## 2019-09-17 DIAGNOSIS — B373 Candidiasis of vulva and vagina: Secondary | ICD-10-CM | POA: Diagnosis not present

## 2019-09-17 DIAGNOSIS — J45909 Unspecified asthma, uncomplicated: Secondary | ICD-10-CM | POA: Diagnosis not present

## 2019-09-17 DIAGNOSIS — R739 Hyperglycemia, unspecified: Secondary | ICD-10-CM

## 2019-09-17 LAB — URINALYSIS, ROUTINE W REFLEX MICROSCOPIC
Bilirubin Urine: NEGATIVE
Glucose, UA: >=500 mg/dL — AB
Ketones, ur: NEGATIVE mg/dL
Nitrite: NEGATIVE
Protein, ur: 100 mg/dL — AB
Specific Gravity, Urine: 1.027 (ref 1.005–1.030)
pH: 5 (ref 5.0–8.0)

## 2019-09-17 LAB — BASIC METABOLIC PANEL
Anion gap: 11 (ref 5–15)
BUN: 12 mg/dL (ref 6–20)
CO2: 23 mmol/L (ref 22–32)
Calcium: 9.6 mg/dL (ref 8.9–10.3)
Chloride: 99 mmol/L (ref 98–111)
Creatinine, Ser: 1.02 mg/dL — ABNORMAL HIGH (ref 0.44–1.00)
GFR calc Af Amer: >60 mL/min (ref >60)
GFR calc non Af Amer: >60 mL/min (ref >60)
Glucose, Bld: 347 mg/dL — ABNORMAL HIGH (ref 70–99)
Potassium: 4.4 mmol/L (ref 3.5–5.1)
Sodium: 133 mmol/L — ABNORMAL LOW (ref 135–145)

## 2019-09-17 LAB — CBG MONITORING, ED
Glucose-Capillary: 367 mg/dL — ABNORMAL HIGH (ref 70–99)
Glucose-Capillary: 269 mg/dL — ABNORMAL HIGH (ref 70–99)

## 2019-09-17 LAB — WET PREP, GENITAL
Clue Cells Wet Prep HPF POC: NONE SEEN
Sperm: NONE SEEN
Trich, Wet Prep: NONE SEEN

## 2019-09-17 LAB — CBC
HCT: 46.4 % — ABNORMAL HIGH (ref 36.0–46.0)
Hemoglobin: 15.3 g/dL — ABNORMAL HIGH (ref 12.0–15.0)
MCH: 28.4 pg (ref 26.0–34.0)
MCHC: 33 g/dL (ref 30.0–36.0)
MCV: 86.1 fL (ref 80.0–100.0)
Platelets: 175 10*3/uL (ref 150–400)
RBC: 5.39 MIL/uL — ABNORMAL HIGH (ref 3.87–5.11)
RDW: 12.5 % (ref 11.5–15.5)
WBC: 4.9 10*3/uL (ref 4.0–10.5)
nRBC: 0 % (ref 0.0–0.2)

## 2019-09-17 LAB — I-STAT BETA HCG BLOOD, ED (MC, WL, AP ONLY): I-stat hCG, quantitative: <5 m[IU]/mL (ref <5)

## 2019-09-17 MED ORDER — SODIUM CHLORIDE 0.9 % IV BOLUS
1000.0000 mL | Freq: Once | INTRAVENOUS | Status: AC
  Administered 2019-09-17: 14:00:00 1000 mL via INTRAVENOUS

## 2019-09-17 MED ORDER — FLUCONAZOLE 200 MG PO TABS: 200.0000 mg | ORAL_TABLET | Freq: Every day | ORAL | 0 refills | Status: AC

## 2019-09-17 NOTE — Discharge Instructions (Signed)
Please read the attachments on hyperglycemia and diabetes diet.  Please take the Diflucan, as written.  Follow-up with your OB/GYN as well as your endocrinologist for ongoing evaluation and management.    Please return to the ED or seek immediate medical attention should experience any new or worsening symptoms.

## 2019-09-17 NOTE — ED Triage Notes (Signed)
Pt states CBG at home on Friday was 496.  Talked to PCP and has been trying to get it down at home but remains in the 300s.

## 2019-09-17 NOTE — ED Provider Notes (Signed)
[facility] EMERGENCY DEPARTMENT Provider Note   CSN: [id number] Arrival date & time: 09/17/19  1147     History Chief Complaint  Patient presents with  . Hyperglycemia    Miranda Stevens is a 39 y.o. female with past medical history significant for IDDM, obesity, asthma, and PCOS who presents to the ED with complaints of hyperglycemia.  Patient reports that her blood glucose checks at home have ranged in 300s to 400s over the course of the past week.  She has been in contact with her primary care physician, Dr. Lucianne Muss, who is trying to manage her high blood sugars remotely.  However, her family became concerned and encouraged her to come to the ED for evaluation.  She is endorsing fatigue as well as increased urinary frequency.  She also notes that she has had a week long history of vaginal itching and vaginal discharge.  She has had yeast infections in the past and this feels similar.  She denies any fevers or chills, room spinning dizziness, exertional chest pain or shortness of breath, abdominal pain, nausea or vomiting, diminished appetite, vaginal pain, rectal pain, or changes in bowel habits.  HPI     Past Medical History:  Diagnosis Date  . Asthma   . Diabetes mellitus without complication (HCC)   . Headache(784.0)   . High cholesterol   . Migraine   . Migraine   . Pericarditis   . Polycystic disease, ovaries     Patient Active Problem List   Diagnosis Date Noted  . PCOS (polycystic ovarian syndrome) 02/24/2018  . Uncontrolled diabetes mellitus type 2 without complications 11/01/2017  . Mild intermittent asthma without complication 11/01/2017  . History of migraine 11/01/2017  . Seasonal allergies 11/01/2017  . Cigarette nicotine dependence without complication 11/01/2017  . Snoring 11/01/2017  . General counseling for prescription of oral contraceptives 04/04/2012  . Excessive or frequent menstruation 04/04/2012    Past Surgical History:    Procedure Laterality Date  . BIOPSY ENDOMETRIAL       OB History    Gravida  0   Para      Term      Preterm      AB      Living  0     SAB      TAB      Ectopic      Multiple      Live Births              Family History  Problem Relation Age of Onset  . Lung disease Mother   . Diabetes Mother   . Heart disease Mother   . Hyperlipidemia Father     Social History   Tobacco Use  . Smoking status: Former Smoker    Years: 10.00    Types: Cigarettes    Quit date: 01/31/2011    Years since quitting: 8.6  . Smokeless tobacco: Never Used  Substance Use Topics  . Alcohol use: Not Currently  . Drug use: Not Currently    Comment: laswt used 1 week ago    Home Medications Prior to Admission medications   Medication Sig Start Date End Date Taking? Authorizing Provider  acetaminophen (TYLENOL) 500 MG tablet Take 1,000 mg by mouth every 6 (six) hours as needed for mild pain, moderate pain or headache. Pain      [provider]  albuterol (PROVENTIL HFA;VENTOLIN HFA) 108 (90 Base) MCG/ACT inhaler Inhale 1-2 puffs into the lungs every  6 (six) hours as needed for wheezing or shortness of breath.    [provider]  atorvastatin (LIPITOR) 10 MG tablet Take 1 tablet (10 mg total) by mouth daily. 01/23/19   Deeann Saint, MD  CINNAMON PO Take 1 tablet by mouth daily.     [provider]  fluconazole (DIFLUCAN) 200 MG tablet Take 1 tablet (200 mg total) by mouth daily for 2 doses. Take one today and another one in 24-48 hours if symptoms fail to improve. 09/17/19 09/19/19  Lorelee New, PA-C  insulin aspart (NOVOLOG FLEXPEN) 100 UNIT/ML FlexPen INJECT 6 UNITS UNDER THE SKIN 3 TIMES DAILY BEFORE MEALS. 07/18/18   Reather Littler, MD  insulin glargine (LANTUS) 100 UNIT/ML injection Inject 0.14 mLs (14 Units total) into the skin daily. INJECT 14 UNITS UNDER THE SKIN BEFORE BED. Patient taking differently: Inject 18 Units into the skin at bedtime.  INJECT 18 UNITS UNDER THE SKIN AT NIGHT. 05/19/18   Reather Littler, MD  Insulin Pen Needle (PEN NEEDLES) 32G X 6 MM MISC 1 each by Subcutaneous Infusion route as directed. 11/01/17   Deeann Saint, MD  magic mouthwash SOLN Take 5 mLs by mouth 4 (four) times daily as needed for mouth pain. 05/20/18   Wynn Banker, MD  medroxyPROGESTERone (DEPO-PROVERA) 150 MG/ML injection Inject 1 mL (150 mg total) into the muscle every 3 (three) months. 11/02/17   Armando Reichert, CNM  metFORMIN (GLUCOPHAGE-XR) 500 MG 24 hr tablet Take 4 tablets by mouth daily. **Needs appt for further refills.** 06/29/19   Reather Littler, MD  rizatriptan (MAXALT) 10 MG tablet Take 1 tablet (10 mg total) by mouth as needed for migraine. May repeat in 2 hours if needed 01/23/19   Deeann Saint, MD  STEGLATRO 15 MG TABS Take 1 tablet by mouth daily. 07/07/19   Reather Littler, MD  triamcinolone cream (KENALOG) 0.1 % Apply topically 2 (two) times daily. Apply to affected area Patient taking differently: Apply 1 application topically 2 (two) times daily as needed (eczema).  03/15/12   Janne Napoleon, NP    Allergies    Aspirin  Review of Systems   Review of Systems  Constitutional: Positive for fatigue. Negative for fever.  Respiratory: Negative for shortness of breath.   Gastrointestinal: Negative for abdominal pain.  Genitourinary: Positive for vaginal discharge.  Neurological: Positive for light-headedness. Negative for dizziness and weakness.    Physical Exam Updated Vital Signs BP (!) 121/97 (BP Location: Right Arm)   Pulse 92   Temp 98 F (36.7 C) (Oral)   Resp 16   SpO2 97%   Physical Exam Vitals and nursing note reviewed. Exam conducted with a chaperone present.  Constitutional:      Appearance: Normal appearance. She is obese.  HENT:     Head: Normocephalic and atraumatic.  Eyes:     General: No scleral icterus.    Conjunctiva/sclera: Conjunctivae normal.  Cardiovascular:     Rate and Rhythm: Normal rate and  regular rhythm.  Pulmonary:     Effort: Pulmonary effort is normal. No respiratory distress.     Breath sounds: Normal breath sounds.  Genitourinary:    Comments: External exam: Dry, red external vulva.  Mild thick white discharge near vaginal opening. Speculum exam: Mildly erythematous.  Mild white discharge present.  Cervical os closed. Bimanual exam: No CMT.  No adnexal tenderness. Musculoskeletal:     Cervical back: Normal range of motion. No rigidity.  Skin:  General: Skin is dry.     Capillary Refill: Capillary refill takes less than 2 seconds.  Neurological:     Mental Status: She is alert and oriented to person, place, and time.     GCS: GCS eye subscore is 4. GCS verbal subscore is 5. GCS motor subscore is 6.  Psychiatric:        Mood and Affect: Mood normal.        Behavior: Behavior normal.        Thought Content: Thought content normal.     ED Results / Procedures / Treatments   Labs (all labs ordered are listed, but only abnormal results are displayed) Labs Reviewed  WET PREP, GENITAL - Abnormal; Notable for the following components:      Result Value   Yeast Wet Prep HPF POC   (*)    Value: Swab received with less than 0.5 mL of saline, saline added to specimen, interpret results with caution.   WBC, Wet Prep HPF POC MODERATE (*)    All other components within normal limits  BASIC METABOLIC PANEL - Abnormal; Notable for the following components:   Sodium 133 (*)    Glucose, Bld 347 (*)    Creatinine, Ser 1.02 (*)    All other components within normal limits  CBC - Abnormal; Notable for the following components:   RBC 5.39 (*)    Hemoglobin 15.3 (*)    HCT 46.4 (*)    All other components within normal limits  URINALYSIS, ROUTINE W REFLEX MICROSCOPIC - Abnormal; Notable for the following components:   APPearance CLOUDY (*)    Glucose, UA >=500 (*)    Hgb urine dipstick LARGE (*)    Protein, ur 100 (*)    Leukocytes,Ua TRACE (*)    Bacteria, UA RARE (*)     All other components within normal limits  CBG MONITORING, ED - Abnormal; Notable for the following components:   Glucose-Capillary 367 (*)    All other components within normal limits  CBG MONITORING, ED - Abnormal; Notable for the following components:   Glucose-Capillary 269 (*)    All other components within normal limits  URINE CULTURE  I-STAT BETA HCG BLOOD, ED ([facility], [facility], [facility] ONLY)  CBG MONITORING, ED  CBG MONITORING, ED  GC/CHLAMYDIA PROBE AMP ([place]) NOT AT [facility]    EKG None  Radiology No results found.  Procedures Procedures (including critical care time)  Medications Ordered in ED Medications  sodium chloride 0.9 % bolus 1,000 mL (0 mLs Intravenous Stopped 09/17/19 1545)    ED Course  I have reviewed the triage vital signs and the nursing notes.  Pertinent labs & imaging results that were available during my care of the patient were reviewed by me and considered in my medical decision making (see chart for details).    MDM Rules/Calculators/A&P                      Patient presents to the ED with chief complaint of hyperglycemia.  No ketones in the urine and no metabolic acidosis.  She is endorsing some fatigue symptoms and polyuria which is consistent with her hyperglycemia, but she does not appear to be in DKA or HHS.  Will provide patient with 1 L IV NS.  Patient is also endorsing a 1 week history of vaginal itching and discharge consistent with her history of yeast infection.  Performed vaginal exam and findings were consistent with her suspected diagnosis.  The wet prep obtained appeared to be diluted and and consequently unreliable.    We will treat with Diflucan.  We will also send urine for culture in the event that her increased urinary frequency is consequence of UTI.  Patient's primary care provider is an OB/GYN she will follow up with her.  Initially planned to provide 8 units regular insulin here in the ED along with the IVF, however the order was not  processed.  On reevaluation an hour later, patient's blood glucose improved to 69 and she was feeling improved clinically.  She was feeling less fatigued and prepared to go home.  She was simply requesting dietary recommendations.  Her endocrinologist is recommending that she have a target blood sugar of 170, however she will take her insulin at home and continue to monitor closely.  Strict ED return precautions discussed with patient.  All of the evaluation and work-up results were discussed with the patient and any family at bedside. They were provided opportunity to ask any additional questions and have none at this time. They have expressed understanding of verbal discharge instructions as well as return precautions and are agreeable to the plan.    Final Clinical Impression(s) / ED Diagnoses Final diagnoses:  Hyperglycemia  Candida vaginitis    Rx / DC Orders ED Discharge Orders         Ordered    fluconazole (DIFLUCAN) 200 MG tablet  Daily     09/17/19 1602           Lorelee New, PA-C 09/17/19 1602    Maia Plan, MD 09/20/19 [phone]

## 2019-09-18 LAB — GC/CHLAMYDIA PROBE AMP (~~LOC~~) NOT AT ARMC
Chlamydia: NEGATIVE
Comment: NEGATIVE
Comment: NORMAL
Neisseria Gonorrhea: NEGATIVE

## 2019-09-18 LAB — URINE CULTURE

## 2019-09-21 ENCOUNTER — Ambulatory Visit (INDEPENDENT_AMBULATORY_CARE_PROVIDER_SITE_OTHER): Payer: 59 | Admitting: *Deleted

## 2019-09-21 ENCOUNTER — Other Ambulatory Visit: Payer: Self-pay

## 2019-09-21 VITALS — BP 145/88 | HR 72

## 2019-09-21 DIAGNOSIS — Z3042 Encounter for surveillance of injectable contraceptive: Secondary | ICD-10-CM

## 2019-09-21 DIAGNOSIS — F3289 Other specified depressive episodes: Secondary | ICD-10-CM

## 2019-09-21 DIAGNOSIS — F419 Anxiety disorder, unspecified: Secondary | ICD-10-CM

## 2019-09-21 MED ORDER — MEDROXYPROGESTERONE ACETATE 150 MG/ML IM SUSP
150.0000 mg | Freq: Once | INTRAMUSCULAR | Status: AC
  Administered 2019-09-21: 150 mg via INTRAMUSCULAR

## 2019-09-21 NOTE — Patient Instructions (Signed)
Needs annual exam 10/21 Need pap 10/2020

## 2019-09-21 NOTE — Progress Notes (Signed)
Agree with A & P. 

## 2019-09-21 NOTE — Progress Notes (Addendum)
Here for depo-provera . Tolerated without complaint. Will schedule next injection in 12 weeks. Advised due for annual exam 03/2020 and pap smear 10/2020. Also noted + phq9 and gad 7 today. Offered referral to [facility] which she accepted. Explained registrar will schedule at checkout. She voices understanding. Nazaire Cordial,RN

## 2019-09-22 NOTE — BH Specialist Note (Signed)
Pt did not arrive to video visit and did not answer the phone; Voicemail not set up so unable to leave voice message; left MyChart message for patient.  Integrated Behavioral Health via Telemedicine Video Visit  09/22/2019 Miranda Stevens [id number]  [name]

## 2019-09-27 ENCOUNTER — Other Ambulatory Visit: Payer: Self-pay

## 2019-09-27 ENCOUNTER — Ambulatory Visit: Payer: 59 | Admitting: Clinical

## 2019-10-03 ENCOUNTER — Other Ambulatory Visit: Payer: Self-pay

## 2019-10-03 ENCOUNTER — Encounter: Payer: Self-pay | Admitting: Endocrinology

## 2019-10-03 ENCOUNTER — Ambulatory Visit (INDEPENDENT_AMBULATORY_CARE_PROVIDER_SITE_OTHER): Payer: 59 | Admitting: Endocrinology

## 2019-10-03 VITALS — BP 118/84 | HR 85 | Ht 71.0 in | Wt 307.0 lb

## 2019-10-03 DIAGNOSIS — Z794 Long term (current) use of insulin: Secondary | ICD-10-CM

## 2019-10-03 DIAGNOSIS — E782 Mixed hyperlipidemia: Secondary | ICD-10-CM

## 2019-10-03 DIAGNOSIS — E1165 Type 2 diabetes mellitus with hyperglycemia: Secondary | ICD-10-CM

## 2019-10-03 LAB — POCT GLYCOSYLATED HEMOGLOBIN (HGB A1C): Hemoglobin A1C: 12.2 % — AB (ref 4.0–5.6)

## 2019-10-03 LAB — GLUCOSE, POCT (MANUAL RESULT ENTRY): POC Glucose: 372 mg/dl — AB (ref 70–99)

## 2019-10-03 MED ORDER — TRESIBA FLEXTOUCH 100 UNIT/ML ~~LOC~~ SOPN: PEN_INJECTOR | SUBCUTANEOUS | 1 refills | Status: DC

## 2019-10-03 MED ORDER — CANAGLIFLOZIN 300 MG PO TABS: 300.0000 mg | ORAL_TABLET | Freq: Every day | ORAL | 3 refills | Status: DC

## 2019-10-03 MED ORDER — FLUCONAZOLE 150 MG PO TABS: 150.0000 mg | ORAL_TABLET | Freq: Once | ORAL | 1 refills | Status: AC

## 2019-10-03 MED ORDER — FREESTYLE LIBRE 2 SENSOR MISC: 2.0000 | 3 refills | Status: DC

## 2019-10-03 MED ORDER — GLUCOSE BLOOD VI STRP: ORAL_STRIP | 1 refills | Status: DC

## 2019-10-03 MED ORDER — ATORVASTATIN CALCIUM 20 MG PO TABS: 20.0000 mg | ORAL_TABLET | Freq: Every day | ORAL | 3 refills | Status: DC

## 2019-10-03 MED ORDER — FREESTYLE LIBRE 2 READER DEVI: 1.0000 | Freq: Once | 0 refills | Status: AC

## 2019-10-03 MED ORDER — ONETOUCH DELICA LANCETS 30G MISC: 1 refills | Status: AC

## 2019-10-03 NOTE — Progress Notes (Signed)
Patient ID: Ardeth Sportsman, female   DOB: 10-13-1980, 39 y.o.   MRN: [id number]            Reason for Appointment: Follow-up for Type 2 Diabetes  Referring physician: Grier Mitts   History of Present Illness:          Date of diagnosis of type 2 diabetes mellitus: 2013       Background history:  She was diagnosed to have diabetes as part of her evaluation for PCOS with a glucose tolerance test She was then put on metformin which she has continued Most of her diabetes care has been in [place] and no records are available Because of her sugars going up last year Amaryl was added to her and metformin continued unchanged Not clear what her A1c was at that time  Recent history:   Most recent A1c is 12.2, previously as low as 7.4   INSULIN regimen is: LANTUS 18 units, NovoLog 6-10 units before meals       Non-insulin hypoglycemic drugs the patient is taking are: Metformin ER 2 g, was on Steglatro 5 mg daily  Current management, blood sugar patterns and problems identified:  She has not been seen in follow-up for almost a year and has been again irregular with a follow-up  Again she has not followed instructions for increasing her insulin doses especially Lantus  She was also told to increase her Steglatro up to 15 mg and not clear if she did this  She has not had a refill on her Steglatro this year  She is still using the generic [employer] meter for checking her blood sugar  She thinks her diet is usually low in carbohydrates  However still has difficulty losing weight  Blood sugars are being checked mostly sporadically mostly in the mornings  She cannot always take the full dose of Metformin because of diarrhea   She does try to walk up to 1/2 hour but not daily        Side effects from medications have been: Diarrhea from regular metformin  Compliance with the medical regimen: Inconsistent  Glucose monitoring:  done generally 1 times a day          Glucometer:  [employer] brand       Blood Glucose readings by patient reading off her numbers  Still has not checked on the preferred brand of her glucose meter from insurance   PRE-MEAL Fasting Lunch Dinner Bedtime Overall  Glucose range: 294-404  294  275, 300    Mean/median:        POST-MEAL PC Breakfast PC Lunch PC Dinner  Glucose range:   556 356  Mean/median:      Previous readings:  PRE-MEAL Fasting Lunch Dinner Bedtime Overall  Glucose range:  186-255      Mean/median:      202   POST-MEAL PC Breakfast PC Lunch PC Dinner  Glucose range:   144  207  Mean/median:        Dietician visit, most recent: 02/24/2018  Weight history:  Wt Readings from Last 3 Encounters:  10/03/19 (!) 307 lb (139.3 kg)  07/06/19 (!) 309 lb 11.2 oz (140.5 kg)  03/06/19 (!) 316 lb 11.2 oz (143.7 kg)    Glycemic control:   Lab Results  Component Value Date   HGBA1C 12.2 (A) 10/03/2019   HGBA1C 8.8 (H) 07/06/2019   HGBA1C 11.7 (A) 07/08/2018   Lab Results  Component Value Date   MICROALBUR 6.4 (H)  07/05/2018   [place] 95 03/03/2018   CREATININE 1.02 (H) 09/17/2019   Lab Results  Component Value Date   MICRALBCREAT 6.5 07/05/2018    Lab Results  Component Value Date   FRUCTOSAMINE 388 (H) 10/25/2018   FRUCTOSAMINE 446 (H) 07/05/2018   FRUCTOSAMINE 379 (H) 04/22/2018      Allergies as of 10/03/2019      Reactions   Aspirin Other (See Comments)   NO aspirin      Medication List       Accurate as of Oct 03, 2019  4:24 PM. If you have any questions, ask your nurse or doctor.        STOP taking these medications   Steglatro 15 MG Tabs Generic drug: Ertugliflozin L-PyroglutamicAc Stopped by: Elayne Snare, MD     TAKE these medications   acetaminophen 500 MG tablet Commonly known as: TYLENOL Take 1,000 mg by mouth every 6 (six) hours as needed for mild pain, moderate pain or headache. Pain   albuterol 108 (90 Base) MCG/ACT inhaler Commonly known as: VENTOLIN HFA  Inhale 1-2 puffs into the lungs every 6 (six) hours as needed for wheezing or shortness of breath.   atorvastatin 20 MG tablet Commonly known as: LIPITOR Take 1 tablet (20 mg total) by mouth daily. What changed:   medication strength  how much to take Changed by: Elayne Snare, MD   canagliflozin 300 MG Tabs tablet Commonly known as: Invokana Take 1 tablet (300 mg total) by mouth daily before breakfast. Started by: Elayne Snare, MD   CINNAMON PO Take 1 tablet by mouth daily.   fluconazole 150 MG tablet Commonly known as: DIFLUCAN Take 1 tablet (150 mg total) by mouth once for 1 dose.   FreeStyle Libre 2 Reader Devi 1 Device by Does not apply route once for 1 dose. Started by: Elayne Snare, MD   FreeStyle Libre 2 Sensor Misc 2 Devices by Does not apply route every 14 (fourteen) days. Started by: Elayne Snare, MD   glucose blood test strip Use Onetouch verio test strips as instructed to check blood sugar 3 times daily. What changed:   how much to take  how to take this  when to take this Changed by: Jayme Cloud, LPN   insulin aspart 100 UNIT/ML FlexPen Commonly known as: NovoLOG FlexPen INJECT 6 UNITS UNDER THE SKIN 3 TIMES DAILY BEFORE MEALS. What changed: additional instructions   insulin glargine 100 UNIT/ML injection Commonly known as: LANTUS Inject 0.14 mLs (14 Units total) into the skin daily. INJECT 14 UNITS UNDER THE SKIN BEFORE BED. What changed:   how much to take  when to take this  additional instructions   magic mouthwash Soln Take 5 mLs by mouth 4 (four) times daily as needed for mouth pain.   medroxyPROGESTERone 150 MG/ML injection Commonly known as: DEPO-PROVERA Inject 1 mL (150 mg total) into the muscle every 3 (three) months.   metFORMIN 500 MG 24 hr tablet Commonly known as: GLUCOPHAGE-XR Take 4 tablets by mouth daily. **Needs appt for further refills.**   OneTouch Delica Lancets 32G Misc Use onetouch delica lancets to check blood sugar  3 times daily. What changed:   how much to take  how to take this  when to take this Changed by: Jayme Cloud, LPN   OneTouch Verio Flex System w/Device Kit 1 each by Does not apply route in the morning, at noon, and at bedtime. Use onetouch verio flex to check blood sugar three times  daily.   Pen Needles 32G X 6 MM Misc 1 each by Subcutaneous Infusion route as directed.   rizatriptan 10 MG tablet Commonly known as: MAXALT Take 1 tablet (10 mg total) by mouth as needed for migraine. May repeat in 2 hours if needed   triamcinolone cream 0.1 % Commonly known as: KENALOG Apply topically 2 (two) times daily. Apply to affected area       Allergies:  Allergies  Allergen Reactions  . Aspirin Other (See Comments)    NO aspirin    Past Medical History:  Diagnosis Date  . Asthma   . Diabetes mellitus without complication ([place])   . Headache(784.0)   . High cholesterol   . Migraine   . Migraine   . Pericarditis   . Polycystic disease, ovaries     Past Surgical History:  Procedure Laterality Date  . BIOPSY ENDOMETRIAL      Family History  Problem Relation Age of Onset  . Lung disease Mother   . Diabetes Mother   . Heart disease Mother   . Hyperlipidemia Father     Social History:  reports that she quit smoking about 8 years ago. Her smoking use included cigarettes. She quit after 10.00 years of use. She has never used smokeless tobacco. She reports previous alcohol use. She reports previous drug use.   Review of Systems   Lipid history: On treatment with Lipitor 2m  prescribed by PCP with results as follows    Lab Results  Component Value Date   CHOL 201 (H) 07/06/2019   HDL 25.20 (L) 07/06/2019   LDLCALC 95 03/03/2018   LDLDIRECT 149.0 07/06/2019   TRIG 209.0 (H) 07/06/2019   CHOLHDL 8 07/06/2019           Hypertension: Has not been treated for this  BP Readings from Last 3 Encounters:  10/03/19 118/84  09/21/19 (!) 145/88  09/17/19 126/86     Most recent eye exam was in 2019  Most recent foot exam: 9/19  Currently known complications of diabetes: None  LABS:  Office Visit on 10/03/2019  Component Date Value Ref Range Status  . Hemoglobin A1C 10/03/2019 12.2* 4.0 - 5.6 % Final  . POC Glucose 10/03/2019 372* 70 - 99 mg/dl Final    Physical Examination:  BP 118/84 (BP Location: Left Arm, Patient Position: Sitting, Cuff Size: Large)   Pulse 85   Ht '5\' 11"'  (1.803 m)   Wt (!) 307 lb (139.3 kg)   SpO2 95%   BMI 42.82 kg/m      ASSESSMENT:  Diabetes type 2 with morbid obesity  See history of present illness for detailed discussion of current diabetes management, blood sugar patterns and problems identified  Her A1c is about the highest that she has at 12.2  Although she previously had done relatively better with an SGLT2 drug her blood sugars are much higher now since she has not taken her Steglatro now so she does not think this is covered by insurance now Blood sugars are fairly consistently over 300 and as high as 404 this morning She is also likely not getting enough insulin to cover her meals Diet seems to be reasonably good with not excessive amount of carbohydrate or high fat intake However she can do better with her exercise  We will discussed the actions of basal insulin and the need to titrate her Lantus to get her morning sugars back down She is also concerned about cost of brand-name medications Otherwise is  also a good candidate for adding a GLP-1 drug  Will need A1c on the next visit  Lipids: Not well controlled and needs higher doses of Lipitor    PLAN:    Lantus 30 units and titrate by 2 units every 3 days until morning sugars are back to target of 130 or less  Increase NovoLog to at least 12 units and as much of 16 units if eating larger meals or higher correction dose needed  May increase walking  Start using One Touch Verio meter  We will also send prescription for freestyle libre if  it is covered by insurance  She was given a booklet on carbohydrate counting  When she finishes Lantus she will switch to [place] same doses, co-pay card and information given  Invokana 300 mg daily, co-pay card can be obtained online  She will make sure she checks her sugars 3-4 times a day and more often if having the freestyle libre  If she has any yeast infection she will take Diflucan as needed  Otherwise encouraged her to increase fluid intake and fair attention to genital hygiene  Emphasized the need for regular follow-up and needs to see her back in 1 month   Increase Lipitor to 20 mg   Patient Instructions  Novolog 12-16 units before each meal  Lantus 30 units and go up to get am sugar < 130  Take 36m Lipitor  Check blood sugars on waking up 7 days a week  Also check blood sugars about 2 hours after meals and do this after different meals by rotation  Recommended blood sugar levels on waking up are 90-130 and about 2 hours after meal is 130-160  Please bring your blood sugar monitor to each visit, thank you        AElayne Snare5/08/2019, 4:24 PM   Note: This office note was prepared with Dragon voice recognition system technology. Any transcriptional errors that result from this process are unintentional.

## 2019-10-03 NOTE — Patient Instructions (Addendum)
Novolog 12-16 units before each meal  Lantus 30 units and go up to get am sugar < 130  Take 20mg  Lipitor  Check blood sugars on waking up 7 days a week  Also check blood sugars about 2 hours after meals and do this after different meals by rotation  Recommended blood sugar levels on waking up are 90-130 and about 2 hours after meal is 130-160  Please bring your blood sugar monitor to each visit, thank you

## 2019-10-31 ENCOUNTER — Other Ambulatory Visit: Payer: Self-pay

## 2019-10-31 ENCOUNTER — Other Ambulatory Visit (INDEPENDENT_AMBULATORY_CARE_PROVIDER_SITE_OTHER): Payer: 59

## 2019-10-31 DIAGNOSIS — E1165 Type 2 diabetes mellitus with hyperglycemia: Secondary | ICD-10-CM | POA: Diagnosis not present

## 2019-10-31 DIAGNOSIS — Z794 Long term (current) use of insulin: Secondary | ICD-10-CM | POA: Diagnosis not present

## 2019-10-31 DIAGNOSIS — E782 Mixed hyperlipidemia: Secondary | ICD-10-CM

## 2019-10-31 LAB — MICROALBUMIN / CREATININE URINE RATIO
Creatinine,U: 136.2 mg/dL
Microalb Creat Ratio: 0.5 mg/g (ref 0.0–30.0)
Microalb, Ur: 0.7 mg/dL (ref 0.0–1.9)

## 2019-10-31 LAB — LIPID PANEL
Cholesterol: 187 mg/dL (ref 0–200)
HDL: 26.5 mg/dL — ABNORMAL LOW (ref >39.00)
LDL Cholesterol: 122 mg/dL — ABNORMAL HIGH (ref 0–99)
NonHDL: 160.8
Total CHOL/HDL Ratio: 7
Triglycerides: 193 mg/dL — ABNORMAL HIGH (ref 0.0–149.0)
VLDL: 38.6 mg/dL (ref 0.0–40.0)

## 2019-10-31 LAB — URINALYSIS, ROUTINE W REFLEX MICROSCOPIC
Bilirubin Urine: NEGATIVE
Ketones, ur: NEGATIVE
Leukocytes,Ua: NEGATIVE
Nitrite: NEGATIVE
Specific Gravity, Urine: 1.01 (ref 1.000–1.030)
Total Protein, Urine: NEGATIVE
Urine Glucose: >=1000 — AB
Urobilinogen, UA: 0.2 (ref 0.0–1.0)
pH: 6.5 (ref 5.0–8.0)

## 2019-10-31 LAB — COMPREHENSIVE METABOLIC PANEL
ALT: 18 U/L (ref 0–35)
AST: 13 U/L (ref 0–37)
Albumin: 4.2 g/dL (ref 3.5–5.2)
Alkaline Phosphatase: 76 U/L (ref 39–117)
BUN: 17 mg/dL (ref 6–23)
CO2: 25 mEq/L (ref 19–32)
Calcium: 9.4 mg/dL (ref 8.4–10.5)
Chloride: 101 mEq/L (ref 96–112)
Creatinine, Ser: 1.04 mg/dL (ref 0.40–1.20)
GFR: 71.63 mL/min (ref >60.00)
Glucose, Bld: 123 mg/dL — ABNORMAL HIGH (ref 70–99)
Potassium: 3.8 mEq/L (ref 3.5–5.1)
Sodium: 135 mEq/L (ref 135–145)
Total Bilirubin: 0.4 mg/dL (ref 0.2–1.2)
Total Protein: 7.4 g/dL (ref 6.0–8.3)

## 2019-11-01 LAB — FRUCTOSAMINE: Fructosamine: 391 umol/L — ABNORMAL HIGH (ref 0–285)

## 2019-11-02 ENCOUNTER — Ambulatory Visit: Payer: 59 | Admitting: Endocrinology

## 2019-11-07 ENCOUNTER — Ambulatory Visit: Payer: 59 | Admitting: Endocrinology

## 2019-11-07 DIAGNOSIS — Z0289 Encounter for other administrative examinations: Secondary | ICD-10-CM

## 2019-12-07 ENCOUNTER — Ambulatory Visit (INDEPENDENT_AMBULATORY_CARE_PROVIDER_SITE_OTHER): Payer: 59 | Admitting: *Deleted

## 2019-12-07 ENCOUNTER — Other Ambulatory Visit: Payer: Self-pay

## 2019-12-07 VITALS — BP 120/73 | HR 84

## 2019-12-07 DIAGNOSIS — Z3042 Encounter for surveillance of injectable contraceptive: Secondary | ICD-10-CM

## 2019-12-07 MED ORDER — MEDROXYPROGESTERONE ACETATE 150 MG/ML IM SUSP
150.0000 mg | Freq: Once | INTRAMUSCULAR | Status: AC
  Administered 2019-12-07: 150 mg via INTRAMUSCULAR

## 2019-12-07 NOTE — Progress Notes (Signed)
Patient seen and assessed by nursing staff.  Agree with documentation and plan.  

## 2019-12-07 NOTE — Progress Notes (Signed)
Here for depo-provera injection.tolerated without complaint.  Advised to schedule next appointment at checkout and to schedule annual exam.  She also states she missed her [facility] appointment with Asher Muir. Informed her she can reschedule at checkout. She voices understanding. Samanthamarie Ezzell,RN

## 2019-12-13 NOTE — BH Specialist Note (Signed)
Pt did not arrive to video visit and did not answer the phone; Left HIPPA-compliant message to call back Asher Muir from [facility] [employer] [facility] at [phone] (main office) or [phone] (Sagan Maselli's office).  ; left MyChart message for patient.    Integrated Behavioral Health via Telemedicine Video (Caregility) Visit  12/13/2019 Tria Noguera [id number]   Valetta Close [facility]   Depression screen [facility] 2/9 12/07/2019 09/21/2019 02/24/2018 01/06/2018  Decreased Interest 1 3 0 0  Down, Depressed, Hopeless 2 3 0 0  PHQ - 2 Score 3 6 0 0  Altered sleeping 2 3 - -  Tired, decreased energy 2 3 - -  Change in appetite 1 2 - -  Feeling bad or failure about yourself  0 2 - -  Trouble concentrating 2 3 - -  Moving slowly or fidgety/restless 1 2 - -  Suicidal thoughts 0 0 - -  PHQ-9 Score 11 21 - -   GAD 7 : Generalized Anxiety Score 12/07/2019 09/21/2019  Nervous, Anxious, on Edge 2 2  Control/stop worrying 2 2  Worry too much - different things 2 2  Trouble relaxing 1 2  Restless 1 0  Easily annoyed or irritable 1 1  Afraid - awful might happen 2 2  Total GAD 7 Score 11 11

## 2019-12-20 ENCOUNTER — Ambulatory Visit: Payer: 59 | Admitting: Clinical

## 2019-12-20 DIAGNOSIS — Z91199 Patient's noncompliance with other medical treatment and regimen due to unspecified reason: Secondary | ICD-10-CM

## 2019-12-30 ENCOUNTER — Other Ambulatory Visit: Payer: Self-pay | Admitting: Endocrinology

## 2020-01-15 ENCOUNTER — Other Ambulatory Visit: Payer: Self-pay | Admitting: *Deleted

## 2020-01-15 ENCOUNTER — Telehealth: Payer: Self-pay | Admitting: Endocrinology

## 2020-01-15 ENCOUNTER — Telehealth: Payer: Self-pay | Admitting: *Deleted

## 2020-01-15 MED ORDER — FLUCONAZOLE 150 MG PO TABS: 150.0000 mg | ORAL_TABLET | Freq: Once | ORAL | 0 refills | Status: AC

## 2020-01-15 MED ORDER — INSULIN GLARGINE 100 UNIT/ML ~~LOC~~ SOLN: SUBCUTANEOUS | 0 refills | Status: DC

## 2020-01-15 NOTE — Telephone Encounter (Signed)
Spoke with patient. Message sent to provider.

## 2020-01-15 NOTE — Telephone Encounter (Signed)
Diflucan 150mg  one tab only. She needs to be seen; if not planning to come back we need to know

## 2020-01-15 NOTE — Telephone Encounter (Signed)
Prescription sent to local pharmacy, appointments made for labs and follow up.

## 2020-01-15 NOTE — Telephone Encounter (Signed)
Patient called stating she thinks she's developed a yeast infection and she thinks its because of Invokana - patient would like to please have nurse return her call to dicuss, she says its gotten so bad its causing abdominal pain. Ph# [phone]

## 2020-01-15 NOTE — Telephone Encounter (Signed)
Patient states Miranda Stevens is causing another painful yeast infection. Patient is requesting a prescription to treat. Please advise.

## 2020-02-02 ENCOUNTER — Other Ambulatory Visit: Payer: Self-pay | Admitting: Endocrinology

## 2020-02-02 DIAGNOSIS — Z794 Long term (current) use of insulin: Secondary | ICD-10-CM

## 2020-02-06 ENCOUNTER — Other Ambulatory Visit: Payer: Self-pay

## 2020-02-06 ENCOUNTER — Other Ambulatory Visit (INDEPENDENT_AMBULATORY_CARE_PROVIDER_SITE_OTHER): Payer: 59

## 2020-02-06 DIAGNOSIS — E1165 Type 2 diabetes mellitus with hyperglycemia: Secondary | ICD-10-CM | POA: Diagnosis not present

## 2020-02-06 DIAGNOSIS — Z794 Long term (current) use of insulin: Secondary | ICD-10-CM | POA: Diagnosis not present

## 2020-02-06 LAB — BASIC METABOLIC PANEL
BUN: 18 mg/dL (ref 6–23)
CO2: 23 mEq/L (ref 19–32)
Calcium: 9.4 mg/dL (ref 8.4–10.5)
Chloride: 103 mEq/L (ref 96–112)
Creatinine, Ser: 1.04 mg/dL (ref 0.40–1.20)
GFR: 71.53 mL/min (ref >60.00)
Glucose, Bld: 139 mg/dL — ABNORMAL HIGH (ref 70–99)
Potassium: 4 mEq/L (ref 3.5–5.1)
Sodium: 137 mEq/L (ref 135–145)

## 2020-02-06 LAB — HEMOGLOBIN A1C: Hgb A1c MFr Bld: 10.2 % — ABNORMAL HIGH (ref 4.6–6.5)

## 2020-02-07 ENCOUNTER — Other Ambulatory Visit: Payer: Self-pay | Admitting: Endocrinology

## 2020-02-07 ENCOUNTER — Encounter: Payer: Self-pay | Admitting: Endocrinology

## 2020-02-07 ENCOUNTER — Ambulatory Visit (INDEPENDENT_AMBULATORY_CARE_PROVIDER_SITE_OTHER): Payer: 59 | Admitting: Endocrinology

## 2020-02-07 VITALS — BP 124/88 | HR 84 | Ht 70.98 in | Wt 303.8 lb

## 2020-02-07 DIAGNOSIS — Z794 Long term (current) use of insulin: Secondary | ICD-10-CM

## 2020-02-07 DIAGNOSIS — E782 Mixed hyperlipidemia: Secondary | ICD-10-CM | POA: Diagnosis not present

## 2020-02-07 DIAGNOSIS — E1165 Type 2 diabetes mellitus with hyperglycemia: Secondary | ICD-10-CM

## 2020-02-07 MED ORDER — NOVOLOG FLEXPEN 100 UNIT/ML ~~LOC~~ SOPN
PEN_INJECTOR | SUBCUTANEOUS | 0 refills | Status: DC
Start: 2020-02-07 — End: 2020-05-20

## 2020-02-07 MED ORDER — TRULICITY 0.75 MG/0.5ML ~~LOC~~ SOAJ: 0.7500 mg | SUBCUTANEOUS | 0 refills | Status: DC

## 2020-02-07 NOTE — Progress Notes (Signed)
Patient ID: Miranda Stevens, female   DOB: 1980/09/05, 39 y.o.   MRN: [id number]            Reason for Appointment: Follow-up for Type 2 Diabetes  Referring physician: Grier Mitts   History of Present Illness:          Date of diagnosis of type 2 diabetes mellitus: 2013       Background history:  She was diagnosed to have diabetes as part of her evaluation for PCOS with a glucose tolerance test She was then put on metformin which she has continued Most of her diabetes care has been in [place] and no records are available Because of her sugars going up last year Amaryl was added to her and metformin continued unchanged Not clear what her A1c was at that time  Recent history:   Most recent A1c is 10.9 compared to 12.1  A1c has been previously as low as 7.4   INSULIN regimen is: [place] 24 units twice daily, Lantus 30 units at bedtime   Non-insulin hypoglycemic drugs the patient is taking are: Metformin ER 2 g, was on Steglatro 5 mg daily  Current management, blood sugar patterns and problems identified:  She has not been seen in follow-up since 5/21, as before has been irregular with follow-up  She was told to replace Lantus with [place] when she had finished it but she did not understand and replaced her NovoLog with [place] to 3 times per day  Also was started on Invokana 300 mg daily  She now says that she is having persistent yeast infection symptoms with candidal vaginitis despite trying Diflucan; also has excessive frequency of urination  Although her sugars are better than before she has difficulty losing weight, currently only down 4 pounds since last visit  She did try the freestyle libre but apparently the sensor did not work and she did not call for assistance  Blood sugars are being checked  mostly in the mornings and only occasionally later in the day but not after dinner  She cannot always take the full dose of Metformin because of  diarrhea  Exercise regimen: Sometimes goes for walks       Side effects from medications have been: Diarrhea from regular metformin  Compliance with the medical regimen: Inconsistent  Glucose monitoring:  done generally 1 times a day         Glucometer:  [employer] brand       Blood Glucose readings    PRE-MEAL Fasting Lunch Dinner Bedtime Overall  Glucose range:  148-201  208, 273  111-192    Mean/median:     ?   POST-MEAL PC Breakfast PC Lunch PC Dinner  Glucose range:   ?  Mean/median:      Prior blood sugars   PRE-MEAL Fasting Lunch Dinner Bedtime Overall  Glucose range: 294-404  294  275, 300    Mean/median:       Is down only about 4 pounds POST-MEAL PC Breakfast PC Lunch PC Dinner  Glucose range:   556 356  Mean/median:        Dietician visit, most recent: 02/24/2018  Weight history:  Wt Readings from Last 3 Encounters:  02/07/20 (!) 303 lb 12.8 oz (137.8 kg)  10/03/19 (!) 307 lb (139.3 kg)  07/06/19 (!) 309 lb 11.2 oz (140.5 kg)    Glycemic control:   Lab Results  Component Value Date   HGBA1C 10.2 (H) 02/06/2020   HGBA1C 12.2 (A) 10/03/2019  HGBA1C 8.8 (H) 07/06/2019   Lab Results  Component Value Date   MICROALBUR 0.7 10/31/2019   LDLCALC 122 (H) 10/31/2019   CREATININE 1.04 02/06/2020   Lab Results  Component Value Date   MICRALBCREAT 0.5 10/31/2019    Lab Results  Component Value Date   FRUCTOSAMINE 391 (H) 10/31/2019   FRUCTOSAMINE 388 (H) 10/25/2018   FRUCTOSAMINE 446 (H) 07/05/2018      Allergies as of 02/07/2020      Reactions   Aspirin Other (See Comments)   NO aspirin      Medication List       Accurate as of February 07, 2020 11:52 AM. If you have any questions, ask your nurse or doctor.        STOP taking these medications   insulin glargine 100 UNIT/ML injection Commonly known as: LANTUS     TAKE these medications   acetaminophen 500 MG tablet Commonly known as: TYLENOL Take 1,000 mg by mouth every 6 (six)  hours as needed for mild pain, moderate pain or headache. Pain   albuterol 108 (90 Base) MCG/ACT inhaler Commonly known as: VENTOLIN HFA Inhale 1-2 puffs into the lungs every 6 (six) hours as needed for wheezing or shortness of breath.   atorvastatin 20 MG tablet Commonly known as: LIPITOR Take 1 tablet (20 mg total) by mouth daily.   canagliflozin 300 MG Tabs tablet Commonly known as: Invokana Take 1 tablet (300 mg total) by mouth daily before breakfast.   CINNAMON PO Take 1 tablet by mouth daily.   FreeStyle Libre 2 Sensor Misc 2 Devices by Does not apply route every 14 (fourteen) days.   glucose blood test strip Use Onetouch verio test strips as instructed to check blood sugar 3 times daily.   magic mouthwash Soln Take 5 mLs by mouth 4 (four) times daily as needed for mouth pain.   medroxyPROGESTERone 150 MG/ML injection Commonly known as: DEPO-PROVERA Inject 1 mL (150 mg total) into the muscle every 3 (three) months.   metFORMIN 500 MG 24 hr tablet Commonly known as: GLUCOPHAGE-XR Take 4 tablets by mouth daily. **Needs appt for further refills.**   NovoLOG FlexPen 100 UNIT/ML FlexPen Generic drug: insulin aspart INJECT 16 UNITS UNDER THE SKIN 3 TIMES DAILY BEFORE MEALS. What changed: additional instructions   OneTouch Delica Lancets 62M Misc Use onetouch delica lancets to check blood sugar 3 times daily.   OneTouch Verio Flex System w/Device Kit 1 each by Does not apply route in the morning, at noon, and at bedtime. Use onetouch verio flex to check blood sugar three times daily.   Pen Needles 32G X 6 MM Misc 1 each by Subcutaneous Infusion route as directed.   rizatriptan 10 MG tablet Commonly known as: MAXALT Take 1 tablet (10 mg total) by mouth as needed for migraine. May repeat in 2 hours if needed   Tresiba FlexTouch 100 UNIT/ML FlexTouch Pen Generic drug: insulin degludec Start with 30 units and increase as directed   triamcinolone cream 0.1  % Commonly known as: KENALOG Apply topically 2 (two) times daily. Apply to affected area   Trulicity 3.55 HR/4.1UL Sopn Generic drug: Dulaglutide Inject 0.5 mLs (0.75 mg total) into the skin once a week.       Allergies:  Allergies  Allergen Reactions   Aspirin Other (See Comments)    NO aspirin    Past Medical History:  Diagnosis Date   Asthma    Diabetes mellitus without complication ([place])  Headache(784.0)    High cholesterol    Migraine    Migraine    Pericarditis    Polycystic disease, ovaries     Past Surgical History:  Procedure Laterality Date   BIOPSY ENDOMETRIAL      Family History  Problem Relation Age of Onset   Lung disease Mother    Diabetes Mother    Heart disease Mother    Hyperlipidemia Father     Social History:  reports that she quit smoking about 9 years ago. Her smoking use included cigarettes. She quit after 10.00 years of use. She has never used smokeless tobacco. She reports previous alcohol use. She reports previous drug use.   Review of Systems   Lipid history: On treatment with Lipitor 20 mg with results as follows    Lab Results  Component Value Date   CHOL 187 10/31/2019   HDL 26.50 (L) 10/31/2019   LDLCALC 122 (H) 10/31/2019   LDLDIRECT 149.0 07/06/2019   TRIG 193.0 (H) 10/31/2019   CHOLHDL 7 10/31/2019           Hypertension: Not on treatment, currently on Invokana  BP Readings from Last 3 Encounters:  02/07/20 124/88  12/07/19 120/73  10/03/19 118/84    Most recent eye exam was in 2019  Most recent foot exam: 9/19  Currently known complications of diabetes: None  LABS:  Lab on 02/06/2020  Component Date Value Ref Range Status   Sodium 02/06/2020 137  135 - 145 mEq/L Final   Potassium 02/06/2020 4.0  3.5 - 5.1 mEq/L Final   Chloride 02/06/2020 103  96 - 112 mEq/L Final   CO2 02/06/2020 23  19 - 32 mEq/L Final   Glucose, Bld 02/06/2020 139* 70 - 99 mg/dL Final   BUN 02/06/2020 18  6  - 23 mg/dL Final   Creatinine, Ser 02/06/2020 1.04  0.40 - 1.20 mg/dL Final   GFR 02/06/2020 71.53  >60.00 mL/min Final   Calcium 02/06/2020 9.4  8.4 - 10.5 mg/dL Final   Hgb A1c MFr Bld 02/06/2020 10.2* 4.6 - 6.5 % Final   Glycemic Control Guidelines for People with Diabetes:Non Diabetic:  <6%Goal of Therapy: <7%Additional Action Suggested:  >8%     Physical Examination:  BP 124/88 (BP Location: Left Arm, Patient Position: Sitting)    Pulse 84    Ht 5' 10.98" (1.803 m)    Wt (!) 303 lb 12.8 oz (137.8 kg)    SpO2 99%    BMI 42.39 kg/m      ASSESSMENT:  Diabetes type 2 with morbid obesity  See history of present illness for detailed discussion of current diabetes management, blood sugar patterns and problems identified  Her A1c is still significantly high at 10.2  She is not taking NovoLog and postprandial readings are likely high which she does not monitor However she has gone up significantly on her basal insulin although done in error, now taking a total of 78 units daily by combining Lantus and [place] Discussed today the differences between basal and mealtime insulins and need to use both to control both fasting and postprandial readings  Blood sugars are overall better, previously over 300 frequently However not clear if she is benefiting enough from [place]  She is a good candidate for adding a GLP-1 drug because of her marked obesity and lack of control with basal insulin   Lipids: Not well controlled and needs higher doses of Lipitor    PLAN:    On her [place]  dose that she will try 70 units to start with and increase the dose at least 2 units every 3 days until morning sugars are below 130 consistently  NovoLog 14 to 16 units before meals, may take additional 2 to 4 units if premeal blood sugar is high  She will see the diabetes educator for more detailed education along with starting the freestyle libre which she has not been able to do herself  Stop  Invokana and Lantus Discussed with the patient the nature of GLP-1 drugs, the action on various organ systems, improved satiety, how they benefit blood glucose control, as well as the benefit of weight loss. Explained possible side effects of TRULICITY, most commonly nausea that usually improves over time; discussed safety information in package insert.  Demonstrated the medication injection device and injection technique to the patient.  Showed patient possible injection sites To start with 0.75 mg dosage weekly for the first 4 injections and then start titrating further Sample given Patient brochure on Trulicity with enclosed co-pay card given  Check blood sugars consistently after meals  Will need follow-up lipids   Patient Instructions  Check blood sugars on waking up 6-7 days a week  Also check blood sugars about 2 hours after meals and do this after different meals by rotation  Recommended blood sugar levels on waking up are 90-130 and about 2 hours after meal is 130-180  Please bring your blood sugar monitor to each visit, thank you  Tresiba 70 units once daily  NOVOLOG 14-16 UNITS BEFORE EACH meal to keep sugar < 180 after meal Extra 2-4 if high  Start TRULICITYwith the pen as shown once weekly on the same day of the week.  You may inject in the stomach, thigh or arm as indicated in the brochure given.  You will feel fullness of the stomach with starting the medication and should try to keep the portions at meals small.  You may experience nausea in the first few days which usually gets better over time   If any questions or concerns are present call the office or the  [facility] at [phone]. Also visit [email or web address] website for more useful information          Elayne Snare 02/07/2020, 11:52 AM   Note: This office note was prepared with Dragon voice recognition system technology. Any transcriptional errors that result from this process are  unintentional.

## 2020-02-07 NOTE — Patient Instructions (Addendum)
Check blood sugars on waking up 6-7 days a week  Also check blood sugars about 2 hours after meals and do this after different meals by rotation  Recommended blood sugar levels on waking up are 90-130 and about 2 hours after meal is 130-180  Please bring your blood sugar monitor to each visit, thank you  Tresiba 70 units once daily  NOVOLOG 14-16 UNITS BEFORE EACH meal to keep sugar < 180 after meal Extra 2-4 if high  Start TRULICITYwith the pen as shown once weekly on the same day of the week.  You may inject in the stomach, thigh or arm as indicated in the brochure given.  You will feel fullness of the stomach with starting the medication and should try to keep the portions at meals small.  You may experience nausea in the first few days which usually gets better over time   If any questions or concerns are present call the office or the  [facility] at [phone]. Also visit [email or web address] website for more useful information

## 2020-02-07 NOTE — Progress Notes (Signed)
g

## 2020-02-26 ENCOUNTER — Telehealth (INDEPENDENT_AMBULATORY_CARE_PROVIDER_SITE_OTHER): Payer: 59 | Admitting: Family Medicine

## 2020-02-26 DIAGNOSIS — N760 Acute vaginitis: Secondary | ICD-10-CM

## 2020-02-26 NOTE — Telephone Encounter (Signed)
Patient have an appointment this Wednesday, she want a nurse to call her before then state she has had a yeast inf for 8 months and she took meds but its still not going away.

## 2020-02-27 NOTE — Telephone Encounter (Signed)
Returned patients call. She reports she is diabetic and has been to Endocrinologist recently and has been treated for yeast but treatment is not working. Her A1C is elevated at 10. She has been treated several times with no success of treatment. She is not sexually active so not concerned with any other STD's. Patient will follow up with provider at scheduled visit for annual exam.

## 2020-02-28 ENCOUNTER — Encounter: Payer: Self-pay | Admitting: Advanced Practice Midwife

## 2020-02-28 ENCOUNTER — Other Ambulatory Visit: Payer: Self-pay

## 2020-02-28 ENCOUNTER — Ambulatory Visit (INDEPENDENT_AMBULATORY_CARE_PROVIDER_SITE_OTHER): Payer: 59 | Admitting: Advanced Practice Midwife

## 2020-02-28 ENCOUNTER — Other Ambulatory Visit (HOSPITAL_COMMUNITY)
Admission: RE | Admit: 2020-02-28 | Discharge: 2020-02-28 | Disposition: A | Discharge status: Home / Self Care | Payer: 59 | Source: Ambulatory Visit | Attending: Advanced Practice Midwife | Admitting: Advanced Practice Midwife

## 2020-02-28 VITALS — BP 104/63 | HR 78 | Ht 71.0 in | Wt 303.7 lb

## 2020-02-28 DIAGNOSIS — B373 Candidiasis of vulva and vagina: Secondary | ICD-10-CM | POA: Diagnosis not present

## 2020-02-28 DIAGNOSIS — Z0000 Encounter for general adult medical examination without abnormal findings: Secondary | ICD-10-CM | POA: Diagnosis not present

## 2020-02-28 DIAGNOSIS — Z3042 Encounter for surveillance of injectable contraceptive: Secondary | ICD-10-CM | POA: Diagnosis not present

## 2020-02-28 DIAGNOSIS — F329 Major depressive disorder, single episode, unspecified: Secondary | ICD-10-CM | POA: Diagnosis not present

## 2020-02-28 DIAGNOSIS — Z01411 Encounter for gynecological examination (general) (routine) with abnormal findings: Secondary | ICD-10-CM

## 2020-02-28 DIAGNOSIS — F32A Depression, unspecified: Secondary | ICD-10-CM

## 2020-02-28 DIAGNOSIS — B3749 Other urogenital candidiasis: Secondary | ICD-10-CM

## 2020-02-28 DIAGNOSIS — B3731 Acute candidiasis of vulva and vagina: Secondary | ICD-10-CM

## 2020-02-28 LAB — POCT URINALYSIS DIP (DEVICE)
Bilirubin Urine: NEGATIVE
Glucose, UA: 500 mg/dL — AB
Ketones, ur: 15 mg/dL — AB
Nitrite: NEGATIVE
Protein, ur: NEGATIVE mg/dL
Specific Gravity, Urine: 1.02 (ref 1.005–1.030)
Urobilinogen, UA: 0.2 mg/dL (ref 0.0–1.0)
pH: 6 (ref 5.0–8.0)

## 2020-02-28 MED ORDER — SITZ BATH MISC: 1.0000 | 0 refills | Status: DC | PRN

## 2020-02-28 MED ORDER — NYSTATIN 100000 UNIT/GM EX POWD: 1.0000 "application " | Freq: Three times a day (TID) | CUTANEOUS | 11 refills | Status: DC

## 2020-02-28 MED ORDER — MEDROXYPROGESTERONE ACETATE 150 MG/ML IM SUSP
150.0000 mg | Freq: Once | INTRAMUSCULAR | Status: AC
  Administered 2020-02-28: 150 mg via INTRAMUSCULAR

## 2020-02-28 MED ORDER — CLOTRIMAZOLE 1 % EX CREA: 1.0000 "application " | TOPICAL_CREAM | Freq: Two times a day (BID) | CUTANEOUS | 11 refills | Status: DC

## 2020-02-28 MED ORDER — TERCONAZOLE 0.4 % VA CREA: 1.0000 | TOPICAL_CREAM | Freq: Every day | VAGINAL | 11 refills | Status: AC

## 2020-02-28 NOTE — Addendum Note (Signed)
Addended by: Henrietta Dine on: 02/28/2020 05:33 PM   Modules accepted: Orders

## 2020-02-28 NOTE — Progress Notes (Signed)
GYNECOLOGY ANNUAL PREVENTATIVE CARE ENCOUNTER NOTE  Subjective:   Miranda Stevens is a 39 y.o. G0P0 female here for a routine annual gynecologic exam.  Current complaints: frequent yeast infections.   Denies abnormal vaginal bleeding, discharge, pelvic pain, problems with intercourse or other gynecologic concerns.    Gynecologic History No LMP recorded. Patient has had an injection. Contraception: Depo-Provera injections Last Pap: 2019. Results were: normal Last mammogram: NA, age. Results were: NA, age   Obstetric History OB History  Gravida Para Term Preterm AB Living  0         0  SAB TAB Ectopic Multiple Live Births               Past Medical History:  Diagnosis Date  . Asthma   . Diabetes mellitus without complication ([place])   . Headache(784.0)   . High cholesterol   . Migraine   . Migraine   . Pericarditis   . Polycystic disease, ovaries     Past Surgical History:  Procedure Laterality Date  . BIOPSY ENDOMETRIAL      Current Outpatient Medications on File Prior to Visit  Medication Sig Dispense Refill  . acetaminophen (TYLENOL) 500 MG tablet Take 1,000 mg by mouth every 6 (six) hours as needed for mild pain, moderate pain or headache. Pain      . albuterol (PROVENTIL HFA;VENTOLIN HFA) 108 (90 Base) MCG/ACT inhaler Inhale 1-2 puffs into the lungs every 6 (six) hours as needed for wheezing or shortness of breath.    Marland Kitchen atorvastatin (LIPITOR) 20 MG tablet Take 1 tablet (20 mg total) by mouth daily. 90 tablet 3  . Blood Glucose Monitoring Suppl (ONETOUCH VERIO FLEX SYSTEM) w/Device KIT 1 each by Does not apply route in the morning, at noon, and at bedtime. Use onetouch verio flex to check blood sugar three times daily.    . canagliflozin (INVOKANA) 300 MG TABS tablet Take 1 tablet (300 mg total) by mouth daily before breakfast. 30 tablet 3  . CINNAMON PO Take 1 tablet by mouth daily.     . Continuous Blood Gluc Sensor (FREESTYLE LIBRE 2 SENSOR) MISC 2 Devices by  Does not apply route every 14 (fourteen) days. 2 each 3  . Dulaglutide (TRULICITY) 6.50 PT/4.6FK SOPN Inject 0.5 mLs (0.75 mg total) into the skin once a week. 0.5 mL 0  . glucose blood test strip Use Onetouch verio test strips as instructed to check blood sugar 3 times daily. 300 each 1  . insulin aspart (NOVOLOG FLEXPEN) 100 UNIT/ML FlexPen INJECT 16 UNITS UNDER THE SKIN 3 TIMES DAILY BEFORE MEALS. 15 mL 0  . insulin degludec (TRESIBA FLEXTOUCH) 100 UNIT/ML FlexTouch Pen Start with 30 units and increase as directed 5 pen 1  . Insulin Pen Needle (PEN NEEDLES) 32G X 6 MM MISC 1 each by Subcutaneous Infusion route as directed. 100 each 11  . magic mouthwash SOLN Take 5 mLs by mouth 4 (four) times daily as needed for mouth pain. 120 mL 1  . medroxyPROGESTERone (DEPO-PROVERA) 150 MG/ML injection Inject 1 mL (150 mg total) into the muscle every 3 (three) months. 1 mL 4  . metFORMIN (GLUCOPHAGE-XR) 500 MG 24 hr tablet TAKE 4 TABLETS BY MOUTH DAILY. NEED APPOINTMENT FOR FURTHER REFILLS. 120 tablet 0  . OneTouch Delica Lancets 81E MISC Use onetouch delica lancets to check blood sugar 3 times daily. 300 each 1  . rizatriptan (MAXALT) 10 MG tablet Take 1 tablet (10 mg total) by mouth as needed for  migraine. May repeat in 2 hours if needed 10 tablet 11  . triamcinolone cream (KENALOG) 0.1 % Apply topically 2 (two) times daily. Apply to affected area 30 g 0   No current facility-administered medications on file prior to visit.    Allergies  Allergen Reactions  . Aspirin Other (See Comments)    NO aspirin    Social History   Socioeconomic History  . Marital status: Single    Spouse name: Not on file  . Number of children: Not on file  . Years of education: Not on file  . Highest education level: Not on file  Occupational History  . Not on file  Tobacco Use  . Smoking status: Former Smoker    Years: 10.00    Types: Cigarettes    Quit date: 01/31/2011    Years since quitting: 9.0  . Smokeless  tobacco: Never Used  Substance and Sexual Activity  . Alcohol use: Not Currently  . Drug use: Not Currently    Comment: laswt used 1 week ago  . Sexual activity: Not Currently  Other Topics Concern  . Not on file  Social History Narrative  . Not on file   Social Determinants of Health   Financial Resource Strain:   . Difficulty of Paying Living Expenses: Not on file  Food Insecurity: Food Insecurity Present  . Worried About Charity fundraiser in the Last Year: Sometimes true  . Ran Out of Food in the Last Year: Sometimes true  Transportation Needs: No Transportation Needs  . Lack of Transportation (Medical): No  . Lack of Transportation (Non-Medical): No  Physical Activity:   . Days of Exercise per Week: Not on file  . Minutes of Exercise per Session: Not on file  Stress:   . Feeling of Stress : Not on file  Social Connections:   . Frequency of Communication with Friends and Family: Not on file  . Frequency of Social Gatherings with Friends and Family: Not on file  . Attends Religious Services: Not on file  . Active Member of Clubs or Organizations: Not on file  . Attends Archivist Meetings: Not on file  . Marital Status: Not on file  Intimate Partner Violence:   . Fear of Current or Ex-Partner: Not on file  . Emotionally Abused: Not on file  . Physically Abused: Not on file  . Sexually Abused: Not on file    Family History  Problem Relation Age of Onset  . Lung disease Mother   . Diabetes Mother   . Heart disease Mother   . Hyperlipidemia Father     The following portions of the patient's history were reviewed and updated as appropriate: allergies, current medications, past family history, past medical history, past social history, past surgical history and problem list.  Review of Systems Pertinent items noted in HPI and remainder of comprehensive ROS otherwise negative.   Objective:  BP 104/63   Pulse 78   Ht '5\' 11"'  (1.803 m)   Wt (!) 303 lb  11.2 oz (137.8 kg)   BMI 42.36 kg/m  CONSTITUTIONAL: Well-developed, well-nourished female in no acute distress.  HENT:  Normocephalic, atraumatic, External right and left ear normal. Oropharynx is clear and moist EYES: Conjunctivae and EOM are normal. Pupils are equal, round, and reactive to light. No scleral icterus.  NECK: Normal range of motion, supple, no masses.  Normal thyroid.  SKIN: Skin is warm and dry. No rash noted. Not diaphoretic. No erythema. No pallor.  NEUROLOGIC: Alert and oriented to person, place, and time. Normal reflexes, muscle tone coordination. No cranial nerve deficit noted. PSYCHIATRIC: Normal mood and affect. Normal behavior. Normal judgment and thought content. CARDIOVASCULAR: Normal heart rate noted, regular rhythm RESPIRATORY: Clear to auscultation bilaterally. Effort and breath sounds normal, no problems with respiration noted. BREASTS: Symmetric in size. No masses, nipple drainage, or lymphadenopathy. Small scattered patches of tinea noted  ABDOMEN: Soft, normal bowel sounds, no distention noted.  No tenderness, rebound or guarding.  PELVIC: Yeast appearing rash to labia and inner thighs; red and inflamed appearing vaginal mucosa and cervix.  White yeast like discharge noted.  Pap smear obtained.  Normal uterine size, no other palpable masses, no uterine or adnexal tenderness. MUSCULOSKELETAL: Normal range of motion. No tenderness.  No cyanosis, clubbing, or edema.  2+ distal pulses.   Assessment and Plan:  1. Depression, unspecified depression type - Ambulatory referral to [facility]  2. Annual physical exam - pap today - Continue Depo.   3. Yeast infection - Vulvar and vaginal yeast as well as tinea on the breasts.  - RX terazol for vaginal yeast, nystatin powder to apply to skin folds and inner thighs, clotrimazole cream to apply to skin areas.  - Wash underwear and bras in washer with white vinegar - Sitz baths with water and 1/4c of  white vinegar to keep areas on inner thigh clean - A1C is trending in the right direction. It was 12 and it is now 10. Encouraged patient to keep working on this as this will help with the yeast infection more than anything.  - Refills provided for yeast treatment so she does not have to call every time she needs something.   Will follow up results of pap smear and manage accordingly. Mammogram scheduled Routine preventative health maintenance measures emphasized. Please refer to After Visit Summary for other counseling recommendations.    Marcille Buffy DNP, CNM  02/28/20  1:45 PM

## 2020-02-28 NOTE — Patient Instructions (Signed)

## 2020-03-01 LAB — CYTOLOGY - PAP
Comment: NEGATIVE
Diagnosis: NEGATIVE
High risk HPV: NEGATIVE

## 2020-03-06 ENCOUNTER — Ambulatory Visit: Payer: 59 | Admitting: Endocrinology

## 2020-03-15 ENCOUNTER — Ambulatory Visit: Payer: 59

## 2020-03-18 ENCOUNTER — Encounter: Payer: 59 | Admitting: Family Medicine

## 2020-03-26 NOTE — BH Specialist Note (Signed)
error 

## 2020-04-03 ENCOUNTER — Encounter: Payer: 59 | Attending: Endocrinology | Admitting: Nutrition

## 2020-04-04 ENCOUNTER — Ambulatory Visit: Payer: 59 | Admitting: Clinical

## 2020-04-04 DIAGNOSIS — Z5329 Procedure and treatment not carried out because of patient's decision for other reasons: Secondary | ICD-10-CM

## 2020-04-04 NOTE — BH Specialist Note (Signed)
Pt did not arrive to video visit and did not answer the phone at 2:15; and 2:30; left MyChart message for patient.

## 2020-04-17 ENCOUNTER — Encounter: Payer: 59 | Admitting: Family Medicine

## 2020-05-14 ENCOUNTER — Other Ambulatory Visit: Payer: Self-pay

## 2020-05-14 ENCOUNTER — Other Ambulatory Visit (INDEPENDENT_AMBULATORY_CARE_PROVIDER_SITE_OTHER): Payer: 59

## 2020-05-14 ENCOUNTER — Other Ambulatory Visit: Payer: Self-pay | Admitting: Endocrinology

## 2020-05-14 DIAGNOSIS — Z794 Long term (current) use of insulin: Secondary | ICD-10-CM

## 2020-05-14 DIAGNOSIS — E1165 Type 2 diabetes mellitus with hyperglycemia: Secondary | ICD-10-CM | POA: Diagnosis not present

## 2020-05-14 LAB — LIPID PANEL
Cholesterol: 166 mg/dL (ref 0–200)
HDL: 29.3 mg/dL — ABNORMAL LOW (ref >39.00)
NonHDL: 136.35
Total CHOL/HDL Ratio: 6
Triglycerides: 206 mg/dL — ABNORMAL HIGH (ref 0.0–149.0)
VLDL: 41.2 mg/dL — ABNORMAL HIGH (ref 0.0–40.0)

## 2020-05-14 LAB — HEMOGLOBIN A1C: Hgb A1c MFr Bld: 11.5 % — ABNORMAL HIGH (ref 4.6–6.5)

## 2020-05-14 LAB — COMPREHENSIVE METABOLIC PANEL
ALT: 25 U/L (ref 0–35)
AST: 15 U/L (ref 0–37)
Albumin: 4.1 g/dL (ref 3.5–5.2)
Alkaline Phosphatase: 85 U/L (ref 39–117)
BUN: 12 mg/dL (ref 6–23)
CO2: 28 mEq/L (ref 19–32)
Calcium: 9.5 mg/dL (ref 8.4–10.5)
Chloride: 101 mEq/L (ref 96–112)
Creatinine, Ser: 0.96 mg/dL (ref 0.40–1.20)
GFR: 74.87 mL/min (ref >60.00)
Glucose, Bld: 140 mg/dL — ABNORMAL HIGH (ref 70–99)
Potassium: 3.7 mEq/L (ref 3.5–5.1)
Sodium: 136 mEq/L (ref 135–145)
Total Bilirubin: 0.5 mg/dL (ref 0.2–1.2)
Total Protein: 7.6 g/dL (ref 6.0–8.3)

## 2020-05-14 LAB — LDL CHOLESTEROL, DIRECT: Direct LDL: 114 mg/dL

## 2020-05-15 ENCOUNTER — Ambulatory Visit (INDEPENDENT_AMBULATORY_CARE_PROVIDER_SITE_OTHER): Payer: 59 | Admitting: *Deleted

## 2020-05-15 ENCOUNTER — Encounter: Payer: Self-pay | Admitting: *Deleted

## 2020-05-15 VITALS — BP 128/84 | HR 61 | Ht 71.0 in | Wt 304.0 lb

## 2020-05-15 DIAGNOSIS — Z3042 Encounter for surveillance of injectable contraceptive: Secondary | ICD-10-CM

## 2020-05-15 MED ORDER — MEDROXYPROGESTERONE ACETATE 150 MG/ML IM SUSP
150.0000 mg | Freq: Once | INTRAMUSCULAR | Status: AC
  Administered 2020-05-15: 11:00:00 150 mg via INTRAMUSCULAR

## 2020-05-15 NOTE — Progress Notes (Addendum)
Depo Provera 150 mg IM administered as scheduled. Pt tolerated well. Next dose due 3/2-3/16/22. Last Annual Gyn  exam w/pap was performed 02/28/20. Next Annual exam needed after 02/27/21. Pt was offered [facility] due to elevated PHQ-9 and GAD -7 scores noted today. Pt stated that her feelings are related to her diabetes and she will be calling counselor @ her endocrinologist's office. Food insecurity identified. Pt was offered food from the [employer] and declined. Pt voiced understanding of information.

## 2020-05-16 ENCOUNTER — Encounter: Payer: Self-pay | Admitting: Endocrinology

## 2020-05-16 ENCOUNTER — Other Ambulatory Visit: Payer: Self-pay

## 2020-05-16 ENCOUNTER — Ambulatory Visit (INDEPENDENT_AMBULATORY_CARE_PROVIDER_SITE_OTHER): Payer: 59 | Admitting: Endocrinology

## 2020-05-16 VITALS — BP 130/96 | HR 71 | Ht 67.0 in | Wt 306.2 lb

## 2020-05-16 DIAGNOSIS — Z794 Long term (current) use of insulin: Secondary | ICD-10-CM

## 2020-05-16 DIAGNOSIS — E782 Mixed hyperlipidemia: Secondary | ICD-10-CM | POA: Diagnosis not present

## 2020-05-16 DIAGNOSIS — E1165 Type 2 diabetes mellitus with hyperglycemia: Secondary | ICD-10-CM

## 2020-05-16 MED ORDER — TRULICITY 1.5 MG/0.5ML ~~LOC~~ SOAJ: SUBCUTANEOUS | 0 refills | Status: DC

## 2020-05-16 MED ORDER — ROSUVASTATIN CALCIUM 40 MG PO TABS: 40.0000 mg | ORAL_TABLET | Freq: Every day | ORAL | 0 refills | Status: DC

## 2020-05-16 NOTE — Progress Notes (Signed)
Chart reviewed for nurse visit. Agree with plan of care.   Sai Moura [place], CNM 05/16/2020 7:18 PM   

## 2020-05-16 NOTE — Progress Notes (Signed)
Patient ID: Miranda Stevens, female   DOB: 11-17-1980, 39 y.o.   MRN: [id number]            Reason for Appointment: Follow-up for Type 2 Diabetes  Referring physician: Grier Mitts   History of Present Illness:          Date of diagnosis of type 2 diabetes mellitus: 2013       Background history:  She was diagnosed to have diabetes as part of her evaluation for PCOS with a glucose tolerance test She was then put on metformin which she has continued Most of her diabetes care has been in [place] and no records are available Because of her sugars going up last year Amaryl was added to her and metformin continued unchanged Not clear what her A1c was at that time  Recent history:   Most recent A1c is 11.5  A1c has been previously as low as 7.4  INSULIN regimen is: [place] 70 units once daily, Novolog 16 units before meals   Non-insulin hypoglycemic drugs the patient is taking are: Metformin ER 1.5-2 g  Current management, blood sugar patterns and problems identified:  Although she was given a sample of Trulicity and told to call for prescription when finished she did not do so do not do things behind my back  She was told to adjust her Tresiba dose based on the fasting blood sugars using a flowsheet but she has not changed her dosage from the 70 units  She also tried the freestyle libre but for some reason she could not make it work after a few attempts  Appears that her blood sugars are significantly high fasting and probably after dinner also  Did not bring her meter today  Glucose in the afternoon was 140  She thinks she is usually eating a very low carbohydrate breakfast and watching portions otherwise  Previously was doing better with diet and exercise but has gained back some of the weight she has apparently lost  She cannot always take the full dose of Metformin because of diarrhea        Side effects from medications have been: Diarrhea from regular  metformin  Compliance with the medical regimen: Inconsistent  Glucose monitoring:  done generally 1 times a day         Glucometer:  [employer] brand       Blood Glucose readings by recall:   PRE-MEAL Fasting Lunch Dinner Bedtime Overall  Glucose range: 170-220 140-200 170 200   Mean/median:          Dietician visit, most recent: 02/24/2018  Weight history:  Wt Readings from Last 3 Encounters:  05/16/20 (!) 306 lb 3.2 oz (138.9 kg)  05/15/20 (!) 304 lb (137.9 kg)  02/28/20 (!) 303 lb 11.2 oz (137.8 kg)    Glycemic control:   Lab Results  Component Value Date   HGBA1C 11.5 (H) 05/14/2020   HGBA1C 10.2 (H) 02/06/2020   HGBA1C 12.2 (A) 10/03/2019   Lab Results  Component Value Date   MICROALBUR 0.7 10/31/2019   LDLCALC 122 (H) 10/31/2019   CREATININE 0.96 05/14/2020   Lab Results  Component Value Date   MICRALBCREAT 0.5 10/31/2019    Lab Results  Component Value Date   FRUCTOSAMINE 391 (H) 10/31/2019   FRUCTOSAMINE 388 (H) 10/25/2018   FRUCTOSAMINE 446 (H) 07/05/2018      Allergies as of 05/16/2020      Reactions   Aspirin Other (See Comments)   NO aspirin  Medication List       Accurate as of May 16, 2020 11:40 AM. If you have any questions, ask your nurse or doctor.        STOP taking these medications   atorvastatin 20 MG tablet Commonly known as: LIPITOR Stopped by: Elayne Snare, MD   Trulicity 6.20 BT/5.9RC Sopn Generic drug: Dulaglutide Replaced by: Trulicity 1.5 BU/3.8GT Sopn Stopped by: Elayne Snare, MD     TAKE these medications   acetaminophen 500 MG tablet Commonly known as: TYLENOL Take 1,000 mg by mouth every 6 (six) hours as needed for mild pain, moderate pain or headache. Pain   albuterol 108 (90 Base) MCG/ACT inhaler Commonly known as: VENTOLIN HFA Inhale 1-2 puffs into the lungs every 6 (six) hours as needed for wheezing or shortness of breath.   CINNAMON PO Take 1 tablet by mouth daily.   clotrimazole 1 %  cream Commonly known as: Clotrimazole Anti-Fungal Apply 1 application topically 2 (two) times daily.   FreeStyle Libre 2 Sensor Misc 2 Devices by Does not apply route every 14 (fourteen) days.   glucose blood test strip Use Onetouch verio test strips as instructed to check blood sugar 3 times daily.   medroxyPROGESTERone 150 MG/ML injection Commonly known as: DEPO-PROVERA Inject 1 mL (150 mg total) into the muscle every 3 (three) months.   metFORMIN 500 MG 24 hr tablet Commonly known as: GLUCOPHAGE-XR TAKE 4 TABLETS BY MOUTH DAILY. NEED APPOINTMENT FOR FURTHER REFILLS.   NovoLOG FlexPen 100 UNIT/ML FlexPen Generic drug: insulin aspart INJECT 16 UNITS UNDER THE SKIN 3 TIMES DAILY BEFORE MEALS.   nystatin powder Commonly known as: MYCOSTATIN/NYSTOP Apply 1 application topically 3 (three) times daily.   OneTouch Delica Lancets 36I Misc Use onetouch delica lancets to check blood sugar 3 times daily.   OneTouch Verio Flex System w/Device Kit 1 each by Does not apply route in the morning, at noon, and at bedtime. Use onetouch verio flex to check blood sugar three times daily.   Pen Needles 32G X 6 MM Misc 1 each by Subcutaneous Infusion route as directed.   rizatriptan 10 MG tablet Commonly known as: MAXALT Take 1 tablet (10 mg total) by mouth as needed for migraine. May repeat in 2 hours if needed   rosuvastatin 40 MG tablet Commonly known as: Crestor Take 1 tablet (40 mg total) by mouth daily. Started by: Elayne Snare, MD   Sitz Bath Misc 1 Device by Does not apply route as needed.   Tyler Aas FlexTouch 100 UNIT/ML FlexTouch Pen Generic drug: insulin degludec Start with 30 units and increase as directed What changed: how much to take   triamcinolone 0.1 % Commonly known as: KENALOG Apply topically 2 (two) times daily. Apply to affected area   Trulicity 1.5 WO/0.3OZ Sopn Generic drug: Dulaglutide Inject weekly Replaces: Trulicity 2.24 MG/5.0IB Sopn Started by: Elayne Snare, MD       Allergies:  Allergies  Allergen Reactions  . Aspirin Other (See Comments)    NO aspirin    Past Medical History:  Diagnosis Date  . Asthma   . Diabetes mellitus without complication ([place])   . Headache(784.0)   . High cholesterol   . Migraine   . Migraine   . Pericarditis   . Polycystic disease, ovaries     Past Surgical History:  Procedure Laterality Date  . BIOPSY ENDOMETRIAL      Family History  Problem Relation Age of Onset  . Lung disease Mother   .  Diabetes Mother   . Heart disease Mother   . Hyperlipidemia Father     Social History:  reports that she quit smoking about 9 years ago. Her smoking use included cigarettes. She quit after 10.00 years of use. She has never used smokeless tobacco. She reports previous alcohol use. She reports previous drug use.   Review of Systems   Lipid history: On treatment with Lipitor 20 mg She thinks she has taken this regularly but LDL is still not at target    Lab Results  Component Value Date   CHOL 166 05/14/2020   HDL 29.30 (L) 05/14/2020   LDLCALC 122 (H) 10/31/2019   LDLDIRECT 114.0 05/14/2020   TRIG 206.0 (H) 05/14/2020   CHOLHDL 6 05/14/2020           Blood pressure history:  BP Readings from Last 3 Encounters:  05/16/20 (!) 130/96  05/15/20 128/84  02/28/20 104/63    Most recent eye exam was in 2019  Most recent foot exam: 9/19  Currently known complications of diabetes: None  LABS:  Lab on 05/14/2020  Component Date Value Ref Range Status  . Cholesterol 05/14/2020 166  0 - 200 mg/dL Final   ATP III Classification       Desirable:  < 200 mg/dL               Borderline High:  200 - 239 mg/dL          High:  > = 240 mg/dL  . Triglycerides 05/14/2020 206.0* 0.0 - 149.0 mg/dL Final   Normal:  <150 mg/dLBorderline High:  150 - 199 mg/dL  . HDL 05/14/2020 29.30* >39.00 mg/dL Final  . VLDL 05/14/2020 41.2* 0.0 - 40.0 mg/dL Final  . Total CHOL/HDL Ratio 05/14/2020 6   Final                   Men          Women1/2 Average Risk     3.4          3.3Average Risk          5.0          4.42X Average Risk          9.6          7.13X Average Risk          15.0          11.0                      . NonHDL 05/14/2020 136.35   Final   NOTE:  Non-HDL goal should be 30 mg/dL higher than patient's LDL goal (i.e. LDL goal of < 70 mg/dL, would have non-HDL goal of < 100 mg/dL)  . Sodium 05/14/2020 136  135 - 145 mEq/L Final  . Potassium 05/14/2020 3.7  3.5 - 5.1 mEq/L Final  . Chloride 05/14/2020 101  96 - 112 mEq/L Final  . CO2 05/14/2020 28  19 - 32 mEq/L Final  . Glucose, Bld 05/14/2020 140* 70 - 99 mg/dL Final  . BUN 05/14/2020 12  6 - 23 mg/dL Final  . Creatinine, Ser 05/14/2020 0.96  0.40 - 1.20 mg/dL Final  . Total Bilirubin 05/14/2020 0.5  0.2 - 1.2 mg/dL Final  . Alkaline Phosphatase 05/14/2020 85  39 - 117 U/L Final  . AST 05/14/2020 15  0 - 37 U/L Final  . ALT 05/14/2020 25  0 - 35 U/L Final  .  Total Protein 05/14/2020 7.6  6.0 - 8.3 g/dL Final  . Albumin 05/14/2020 4.1  3.5 - 5.2 g/dL Final  . GFR 05/14/2020 74.87  >60.00 mL/min Final   Calculated using the CKD-EPI Creatinine Equation (2021)  . Calcium 05/14/2020 9.5  8.4 - 10.5 mg/dL Final  . Hgb A1c MFr Bld 05/14/2020 11.5* 4.6 - 6.5 % Final   Glycemic Control Guidelines for People with Diabetes:Non Diabetic:  <6%Goal of Therapy: <7%Additional Action Suggested:  >8%   . Direct LDL 05/14/2020 114.0  mg/dL Final   Optimal:  <100 mg/dLNear or Above Optimal:  100-129 mg/dLBorderline High:  130-159 mg/dLHigh:  160-189 mg/dLVery High:  >190 mg/dL    Physical Examination:  BP (!) 130/96   Pulse 71   Ht '5\' 7"'  (1.702 m)   Wt (!) 306 lb 3.2 oz (138.9 kg)   SpO2 96%   BMI 47.96 kg/m      ASSESSMENT:  Diabetes type 2 with morbid obesity  See history of present illness for detailed discussion of current diabetes management, blood sugar patterns and problems identified  Her A1c is still significantly high at  11.5  Even with increasing her basal rate significantly and taking mealtime dose consistently she is not showing much improvement Unable to assess her home blood sugars as she did not bring the monitor she is using Also her blood sugars from A1c are higher than expected from what she is reportedly having at home  Has not been able to start the freestyle libre for technical reasons She was started on Trulicity which she tolerated but did not call for a prescription after sample ran out   Lipids: Not well controlled even with 20 mg Lipitor  Increased blood pressure: She will need to follow-up with her PCP  PLAN:    Start freestyle [place] with the help of the diabetes educator, will also need to have her account linked to our website and she can use her smart phone exclusively  Start titrating [place] to get morning sugars below at least and 50  Titration schedule given with instructions to go up every 3 days by 4 units until morning sugars are at target, will now go up to 76 units  To take 20-24 units of NovoLog at dinnertime or with any other high carb meals and keep postprandial readings under 180 at least  Restart Trulicity and she can go to the 1.5 mg dose since she had tolerated 0.75 well  Next month she will request a prescription for 3 mg dose  Review blood sugars in 4 weeks  Restart regular exercise with brisk walking  Watch portions   Patient Instructions  Trulicity 1.5VV weekly for 4 doses then 65m weekly  Novolog 20-24 units based on meal size and carbs  [facility]      AElayne Snare12/16/2021, 11:40 AM   Note: This office note was prepared with Dragon voice recognition system technology. Any transcriptional errors that result from this process are unintentional.

## 2020-05-16 NOTE — Patient Instructions (Addendum)
Trulicity 1.5mg  weekly for 4 doses then 3mg  weekly  Novolog 20-24 units based on meal size and carbs  Look at [facility] or Guardian sensors

## 2020-05-17 ENCOUNTER — Other Ambulatory Visit: Payer: Self-pay | Admitting: Endocrinology

## 2020-05-20 ENCOUNTER — Encounter: Payer: 59 | Attending: Endocrinology | Admitting: Nutrition

## 2020-05-20 ENCOUNTER — Other Ambulatory Visit: Payer: Self-pay

## 2020-05-20 DIAGNOSIS — E1165 Type 2 diabetes mellitus with hyperglycemia: Secondary | ICD-10-CM | POA: Insufficient documentation

## 2020-05-21 NOTE — Progress Notes (Signed)
Patient had downloaded the LIbrelink app to read the sensors, but she is using the [place] 2 sensors.  She needed to download the Libreview 2 app for this.  Once she did this, the sensor was started and she had no difficulty with this.  We discussed the difference between sensor readings and blood sugar readings, and when she will needing to do blood sugar readings.  She reported good understanding of this, and had no final questions. The readings were linked to our computer system.

## 2020-05-21 NOTE — Patient Instructions (Signed)
Change sensor every 2 weeks Scan sensor at least every 8 hours.

## 2020-05-30 ENCOUNTER — Telehealth: Payer: Self-pay | Admitting: Family Medicine

## 2020-05-30 NOTE — Telephone Encounter (Signed)
Pt is calling in stating that she would like to have a refill on a medication that has not been prescribed by her provider and would like to see if it can be called in today pt is aware we ask for 24-72 hrs for refills.  Rx that pt is needing is albuterol (PROVENTIL HFA; VENTOLIN HFA) 108 MCG  Pharm: [facility] on [address]

## 2020-06-06 ENCOUNTER — Encounter: Payer: Self-pay | Admitting: Family Medicine

## 2020-06-06 ENCOUNTER — Telehealth (INDEPENDENT_AMBULATORY_CARE_PROVIDER_SITE_OTHER): Payer: 59 | Admitting: Family Medicine

## 2020-06-06 DIAGNOSIS — J4520 Mild intermittent asthma, uncomplicated: Secondary | ICD-10-CM | POA: Diagnosis not present

## 2020-06-06 DIAGNOSIS — J329 Chronic sinusitis, unspecified: Secondary | ICD-10-CM | POA: Diagnosis not present

## 2020-06-06 DIAGNOSIS — J302 Other seasonal allergic rhinitis: Secondary | ICD-10-CM | POA: Diagnosis not present

## 2020-06-06 MED ORDER — AMOXICILLIN-POT CLAVULANATE 500-125 MG PO TABS: 1.0000 | ORAL_TABLET | Freq: Two times a day (BID) | ORAL | 0 refills | Status: AC

## 2020-06-06 MED ORDER — FLUTICASONE PROPIONATE 50 MCG/ACT NA SUSP: 1.0000 | Freq: Every day | NASAL | 1 refills | Status: DC

## 2020-06-06 MED ORDER — CETIRIZINE HCL 10 MG PO TABS: 10.0000 mg | ORAL_TABLET | Freq: Every day | ORAL | 11 refills | Status: AC

## 2020-06-06 MED ORDER — ALBUTEROL SULFATE HFA 108 (90 BASE) MCG/ACT IN AERS
1.0000 | INHALATION_SPRAY | Freq: Four times a day (QID) | RESPIRATORY_TRACT | 3 refills | Status: AC | PRN
Start: 2020-06-06 — End: ?

## 2020-06-06 NOTE — Telephone Encounter (Signed)
Pt had a visit today with Dr Salomon Fick

## 2020-06-06 NOTE — Progress Notes (Signed)
Virtual Visit via Telephone Note  I connected with Miranda Stevens on 06/06/20 at  8:00 AM EST by telephone and verified that I am speaking with the correct person using two identifiers.   I discussed the limitations, risks, security and privacy concerns of performing an evaluation and management service by telephone and the availability of in person appointments. I also discussed with the patient that there may be a patient responsible charge related to this service. The patient expressed understanding and agreed to proceed.  Location patient: home Location provider: work or home office Participants present for the call: patient, provider Patient did not have a visit in the prior 7 days to address this/these issue(s).   History of Present Illness: Pt is a 40 yo female with pmh sig for asthma, DM two, migraines, h/o pericarditis, obesity, PCOS who was seen for ongoing concern.  Pt notes recent asthma attack with wheezing more at night.  States symptoms occurred off and on.  Used albuterol inhaler, but realized it was expired.  Notes rhinorrhea and sinus issues x 6 months.  Tried Afrin-like nasal spray.  Feels like one side drains and the other won't.  Pt is starting school at [employer].  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  Pt is back and forth from [place] and [place].   Observations/Objective: Patient sounds cheerful and well on the phone. I do not appreciate any SOB. Speech and thought processing are grossly intact. Patient reported vitals:  Assessment and Stevens: Chronic sinusitis, unspecified location  -Discussed supportive care.  Consider using a humidifier -Consider nasal saline rinse daily -Also okay to use Flonase and daily allergy medication such as Zyrtec, Claritin, Allegra, etc. - Stevens: amoxicillin-clavulanate (AUGMENTIN) 500-125 MG tablet  Seasonal allergies  - Stevens: fluticasone (FLONASE) 50 MCG/ACT nasal spray, cetirizine (ZYRTEC) 10 MG  tablet  Mild intermittent asthma without complication  -Improving - Stevens: albuterol (VENTOLIN HFA) 108 (90 Base) MCG/ACT inhaler    Follow Up Instructions:  As needed   [id number] 5-10 [id number] 11-20 9443 21-30 I did not refer this patient for an OV in the next 24 hours for this/these issue(s).  I discussed the assessment and treatment Stevens with the patient. The patient was provided an opportunity to ask questions and all were answered. The patient agreed with the Stevens and demonstrated an understanding of the instructions.   The patient was advised to call back or seek an in-person evaluation if the symptoms worsen or if the condition fails to improve as anticipated.  I provided 11 minutes of non-face-to-face time during this encounter.   Deeann Saint, MD

## 2020-06-14 ENCOUNTER — Telehealth: Payer: Self-pay | Admitting: Endocrinology

## 2020-06-14 MED ORDER — TRULICITY 1.5 MG/0.5ML ~~LOC~~ SOAJ: SUBCUTANEOUS | 0 refills | Status: DC

## 2020-06-14 NOTE — Telephone Encounter (Signed)
Medication sent to pharmacy Left patient vm

## 2020-06-14 NOTE — Telephone Encounter (Signed)
PLEASE REFILL   trulicity  PHARMACY  [facility] [address] [place], [place] - [address] Phone:  [phone]  Fax:  [phone]

## 2020-06-19 ENCOUNTER — Telehealth: Payer: Self-pay | Admitting: Endocrinology

## 2020-06-19 NOTE — Telephone Encounter (Signed)
Patient called and requested that her pharmacy of choice needs to be update to:  [facility] [address]   [phone] [address] [place], [place] Phone:  [phone]  Fax:  [phone]     Patient currently need refills of the following sent -   Metformin test strips Pen Needles Tresiba Novolog Rosuvastatin

## 2020-06-21 ENCOUNTER — Other Ambulatory Visit: Payer: Self-pay | Admitting: *Deleted

## 2020-06-21 DIAGNOSIS — E1165 Type 2 diabetes mellitus with hyperglycemia: Secondary | ICD-10-CM

## 2020-06-21 MED ORDER — PEN NEEDLES 32G X 6 MM MISC: 1.0000 | 0 refills | Status: AC

## 2020-06-21 MED ORDER — ROSUVASTATIN CALCIUM 40 MG PO TABS: 40.0000 mg | ORAL_TABLET | Freq: Every day | ORAL | 0 refills | Status: AC

## 2020-06-21 MED ORDER — TRESIBA FLEXTOUCH 100 UNIT/ML ~~LOC~~ SOPN: 70.0000 [IU] | PEN_INJECTOR | Freq: Every day | SUBCUTANEOUS | 0 refills | Status: DC

## 2020-06-21 MED ORDER — NOVOLOG FLEXPEN RELION 100 UNIT/ML ~~LOC~~ SOPN: PEN_INJECTOR | SUBCUTANEOUS | 0 refills | Status: DC

## 2020-06-21 MED ORDER — GLUCOSE BLOOD VI STRP: ORAL_STRIP | 1 refills | Status: AC

## 2020-06-21 MED ORDER — METFORMIN HCL ER 500 MG PO TB24: ORAL_TABLET | ORAL | 0 refills | Status: DC

## 2020-06-21 NOTE — Telephone Encounter (Signed)
Pharmacy updated and Rx's have been sent

## 2020-06-24 ENCOUNTER — Telehealth (INDEPENDENT_AMBULATORY_CARE_PROVIDER_SITE_OTHER): Payer: 59 | Admitting: Endocrinology

## 2020-06-24 ENCOUNTER — Encounter: Payer: Self-pay | Admitting: Endocrinology

## 2020-06-24 DIAGNOSIS — E782 Mixed hyperlipidemia: Secondary | ICD-10-CM

## 2020-06-24 DIAGNOSIS — E1165 Type 2 diabetes mellitus with hyperglycemia: Secondary | ICD-10-CM | POA: Diagnosis not present

## 2020-06-24 DIAGNOSIS — Z794 Long term (current) use of insulin: Secondary | ICD-10-CM

## 2020-06-24 MED ORDER — TRULICITY 1.5 MG/0.5ML ~~LOC~~ SOAJ: SUBCUTANEOUS | 1 refills | Status: DC

## 2020-06-24 MED ORDER — TRESIBA FLEXTOUCH 200 UNIT/ML ~~LOC~~ SOPN: 100.0000 [IU] | PEN_INJECTOR | Freq: Every day | SUBCUTANEOUS | 1 refills | Status: DC

## 2020-06-24 NOTE — Progress Notes (Signed)
Patient ID: Miranda Stevens, female   DOB: 04/02/1981, 39 y.o.   MRN: [id number]          I connected with the above-named patient by video enabled telemedicine application and verified that I am speaking with the correct person. The patient was explained the limitations of evaluation and management by telemedicine and the availability of in person appointments.  Patient also understood that there may be a patient responsible charge related to this service . Location of the patient: Patient's home . Location of the provider: Physician office Only the patient and myself were participating in the encounter The patient understood the above statements and agreed to proceed.   Reason for Appointment: Follow-up for Type 2 Diabetes  Referring physician: Shannon Banks   History of Present Illness:          Date of diagnosis of type 2 diabetes mellitus: 2013       Background history:  She was diagnosed to have diabetes as part of her evaluation for PCOS with a glucose tolerance test She was then put on metformin which she has continued Most of her diabetes care has been in [place] and no records are available Because of her sugars going up last year Amaryl was added to her and metformin continued unchanged Not clear what her A1c was at that time  Recent history:   Most recent A1c is 11.5  A1c has been previously as low as 7.4  INSULIN regimen is: Tresiba 70 units once daily, Novolog 24 units before meals   Non-insulin hypoglycemic drugs the patient is taking are: Metformin ER 1.5-2 g  Current management, blood sugar patterns and problems identified:  Although she was able to start Trulicity and has taken her sixth injection her blood sugars are not improving  Now able to better objectively assess her readings with the freestyle libre which she finally started  For the second time she was told to adjust her Tresiba dose based on the fasting blood sugars using a flowsheet but  she has not changed her dosage again from the 70 units  She is mostly eating 1-2 meals, she thinks that her portions are relatively lower with starting Trulicity  However occasionally will go off her diet  She thinks that because of being out of town her blood sugars may be higher recently  Currently with the freestyle libre blood sugars appear to be over 200 at all times and somewhat higher after meals  She is only now planning to start using a recumbent bike for exercise        Side effects from medications have been: Diarrhea from regular metformin  Compliance with the medical regimen: Inconsistent  Interpretation of the freestyle libre download for the last 2 weeks is as follows   Blood sugars on average are above target all 24  hrs.  LOWEST blood sugars on average are fasting, averaging 216 and highest after lunch averaging 275  Accuracy of data has not been confirmed with fingersticks or lab glucose  OVERNIGHT blood sugars are averaging about 270 at midnight and then gradually decreasing until about 12 noon  Postprandial readings appear to go up more significantly in the early afternoon with her first meal and somewhat more after 8 PM  Hypoglycemia  CGM use % of time  60  2-week average/GV  145/19  Time in range    nine    %  % Time Above 180  45  % Time above 250  46  %   Time Below 70      PRE-MEAL Fasting Lunch Dinner Bedtime Overall  Glucose range:       Averages:  275  235  241  274    POST-MEAL PC Breakfast PC Lunch PC Dinner  Glucose range:     Averages:   275  262     Dietician visit, most recent: 02/24/2018  Weight history:  Wt Readings from Last 3 Encounters:  05/16/20 (!) 306 lb 3.2 oz (138.9 kg)  05/15/20 (!) 304 lb (137.9 kg)  02/28/20 (!) 303 lb 11.2 oz (137.8 kg)    Glycemic control:   Lab Results  Component Value Date   HGBA1C 11.5 (H) 05/14/2020   HGBA1C 10.2 (H) 02/06/2020   HGBA1C 12.2 (A) 10/03/2019   Lab Results  Component  Value Date   MICROALBUR 0.7 10/31/2019   LDLCALC 122 (H) 10/31/2019   CREATININE 0.96 05/14/2020   Lab Results  Component Value Date   MICRALBCREAT 0.5 10/31/2019    Lab Results  Component Value Date   FRUCTOSAMINE 391 (H) 10/31/2019   FRUCTOSAMINE 388 (H) 10/25/2018   FRUCTOSAMINE 446 (H) 07/05/2018      Allergies as of 06/24/2020      Reactions   Aspirin Other (See Comments)   NO aspirin      Medication List       Accurate as of June 24, 2020  9:36 AM. If you have any questions, ask your nurse or doctor.        acetaminophen 500 MG tablet Commonly known as: TYLENOL Take 1,000 mg by mouth every 6 (six) hours as needed for mild pain, moderate pain or headache. Pain   albuterol 108 (90 Base) MCG/ACT inhaler Commonly known as: VENTOLIN HFA Inhale 1-2 puffs into the lungs every 6 (six) hours as needed for wheezing or shortness of breath.   cetirizine 10 MG tablet Commonly known as: ZYRTEC Take 1 tablet (10 mg total) by mouth daily.   CINNAMON PO Take 1 tablet by mouth daily.   clotrimazole 1 % cream Commonly known as: Clotrimazole Anti-Fungal Apply 1 application topically 2 (two) times daily.   fluticasone 50 MCG/ACT nasal spray Commonly known as: FLONASE Place 1 spray into both nostrils daily.   FreeStyle Libre 2 Sensor Misc 2 Devices by Does not apply route every 14 (fourteen) days.   glucose blood test strip Use Onetouch verio test strips as instructed to check blood sugar 3 times daily.   medroxyPROGESTERone 150 MG/ML injection Commonly known as: DEPO-PROVERA Inject 1 mL (150 mg total) into the muscle every 3 (three) months.   metFORMIN 500 MG 24 hr tablet Commonly known as: GLUCOPHAGE-XR TAKE 4 TABLETS BY MOUTH DAILY. NEED APPOINTMENT FOR FURTHER REFILLS.   NovoLOG FlexPen ReliOn 100 UNIT/ML FlexPen Generic drug: insulin aspart INJECT 16 UNITS SUBCUTANEOUSLY THREE TIMES DAILY BEFORE MEAL(S)   nystatin powder Commonly known as:  MYCOSTATIN/NYSTOP Apply 1 application topically 3 (three) times daily.   OneTouch Delica Lancets 33A Misc Use onetouch delica lancets to check blood sugar 3 times daily.   OneTouch Verio Flex System w/Device Kit 1 each by Does not apply route in the morning, at noon, and at bedtime. Use onetouch verio flex to check blood sugar three times daily.   Pen Needles 32G X 6 MM Misc 1 each by Subcutaneous Infusion route as directed.   rizatriptan 10 MG tablet Commonly known as: MAXALT Take 1 tablet (10 mg total) by mouth as needed for migraine. May repeat in 2  hours if needed   rosuvastatin 40 MG tablet Commonly known as: Crestor Take 1 tablet (40 mg total) by mouth daily.   Sitz Bath Misc 1 Device by Does not apply route as needed.   Tyler Aas FlexTouch 100 UNIT/ML FlexTouch Pen Generic drug: insulin degludec Inject 70 Units into the skin daily. Start with 30 units and increase as directed   triamcinolone 0.1 % Commonly known as: KENALOG Apply topically 2 (two) times daily. Apply to affected area   Trulicity 1.5 TI/1.4ER Sopn Generic drug: Dulaglutide Inject weekly       Allergies:  Allergies  Allergen Reactions  . Aspirin Other (See Comments)    NO aspirin    Past Medical History:  Diagnosis Date  . Asthma   . Diabetes mellitus without complication ([place])   . Headache(784.0)   . High cholesterol   . Migraine   . Migraine   . Pericarditis   . Polycystic disease, ovaries     Past Surgical History:  Procedure Laterality Date  . BIOPSY ENDOMETRIAL      Family History  Problem Relation Age of Onset  . Lung disease Mother   . Diabetes Mother   . Heart disease Mother   . Hyperlipidemia Father     Social History:  reports that she quit smoking about 9 years ago. Her smoking use included cigarettes. She quit after 10.00 years of use. She has never used smokeless tobacco. She reports previous alcohol use. She reports previous drug use.   Review of  Systems   Lipid history: On treatment with Lipitor 20 mg She thinks she has taken this regularly but LDL is still not at target    Lab Results  Component Value Date   CHOL 166 05/14/2020   HDL 29.30 (L) 05/14/2020   LDLCALC 122 (H) 10/31/2019   LDLDIRECT 114.0 05/14/2020   TRIG 206.0 (H) 05/14/2020   CHOLHDL 6 05/14/2020           Blood pressure history:  BP Readings from Last 3 Encounters:  05/16/20 (!) 130/96  05/15/20 128/84  02/28/20 104/63    Most recent eye exam was in 2019  Most recent foot exam: 9/19  Currently known complications of diabetes: None  LABS:  No visits with results within 1 Week(s) from this visit.  Latest known visit with results is:  Lab on 05/14/2020  Component Date Value Ref Range Status  . Cholesterol 05/14/2020 166  0 - 200 mg/dL Final   ATP III Classification       Desirable:  < 200 mg/dL               Borderline High:  200 - 239 mg/dL          High:  > = 240 mg/dL  . Triglycerides 05/14/2020 206.0* 0.0 - 149.0 mg/dL Final   Normal:  <150 mg/dLBorderline High:  150 - 199 mg/dL  . HDL 05/14/2020 29.30* >39.00 mg/dL Final  . VLDL 05/14/2020 41.2* 0.0 - 40.0 mg/dL Final  . Total CHOL/HDL Ratio 05/14/2020 6   Final                  Men          Women1/2 Average Risk     3.4          3.3Average Risk          5.0          4.42X Average Risk  9.6          7.13X Average Risk          15.0          11.0                      . NonHDL 05/14/2020 136.35   Final   NOTE:  Non-HDL goal should be 30 mg/dL higher than patient's LDL goal (i.e. LDL goal of < 70 mg/dL, would have non-HDL goal of < 100 mg/dL)  . Sodium 05/14/2020 136  135 - 145 mEq/L Final  . Potassium 05/14/2020 3.7  3.5 - 5.1 mEq/L Final  . Chloride 05/14/2020 101  96 - 112 mEq/L Final  . CO2 05/14/2020 28  19 - 32 mEq/L Final  . Glucose, Bld 05/14/2020 140* 70 - 99 mg/dL Final  . BUN 05/14/2020 12  6 - 23 mg/dL Final  . Creatinine, Ser 05/14/2020 0.96  0.40 - 1.20 mg/dL Final   . Total Bilirubin 05/14/2020 0.5  0.2 - 1.2 mg/dL Final  . Alkaline Phosphatase 05/14/2020 85  39 - 117 U/L Final  . AST 05/14/2020 15  0 - 37 U/L Final  . ALT 05/14/2020 25  0 - 35 U/L Final  . Total Protein 05/14/2020 7.6  6.0 - 8.3 g/dL Final  . Albumin 05/14/2020 4.1  3.5 - 5.2 g/dL Final  . GFR 05/14/2020 74.87  >60.00 mL/min Final   Calculated using the CKD-EPI Creatinine Equation (2021)  . Calcium 05/14/2020 9.5  8.4 - 10.5 mg/dL Final  . Hgb A1c MFr Bld 05/14/2020 11.5* 4.6 - 6.5 % Final   Glycemic Control Guidelines for People with Diabetes:Non Diabetic:  <6%Goal of Therapy: <7%Additional Action Suggested:  >8%   . Direct LDL 05/14/2020 114.0  mg/dL Final   Optimal:  <100 mg/dLNear or Above Optimal:  100-129 mg/dLBorderline High:  130-159 mg/dLHigh:  160-189 mg/dLVery High:  >190 mg/dL    Physical Examination:  There were no vitals taken for this visit.     ASSESSMENT:  Diabetes type 2 with morbid obesity  See history of present illness for detailed discussion of current diabetes management, blood sugar patterns and problems identified  Her A1c is on the last visit significantly high at 11.5  She has added Trulicity but has not increased her insulin doses as directed With using the freestyle libre although only 60% of the data is available she is showing marked hyperglycemia at all times with only 9% of readings within target range and 46% over 250  She thinks that Trulicity is helping her eat less but not clear if her weight has come down Also not exercising She appears to be significantly insulin resistant and needing more basal and bolus insulin also   Lipids: LDL not under 100 previously and will need to be rechecked   PLAN:    She will go up to 90 units on Tresiba and switch to U-200 device  Start titrating [place] by 4-6 units every week at least and get morning sugars below at least under 150  Increase NovoLog further to 36-40 units based on [place]  blood sugar and meal size  Also discussed results of her freestyle libre and postprandial targets of at least under 200, ideally 160  Regular exercise  A1c on the next visit  Continue 1.5 Trulicity but consider increasing it on the next dose of she is more adjusted to the side effects  Also consider adding SGLT2 drug  She'll  continue using the freestyle libre but check with her insurance to see if it is better covered with a DME supplier instead of [facility]   There are no Patient Instructions on file for this visit.     Elayne Snare 06/24/2020, 9:36 AM   Note: This office note was prepared with Dragon voice recognition system technology. Any transcriptional errors that result from this process are unintentional.

## 2020-07-31 ENCOUNTER — Ambulatory Visit (INDEPENDENT_AMBULATORY_CARE_PROVIDER_SITE_OTHER): Payer: 59

## 2020-07-31 ENCOUNTER — Other Ambulatory Visit: Payer: Self-pay

## 2020-07-31 VITALS — BP 108/79 | HR 84 | Wt 310.6 lb

## 2020-07-31 DIAGNOSIS — Z3042 Encounter for surveillance of injectable contraceptive: Secondary | ICD-10-CM

## 2020-07-31 MED ORDER — MEDROXYPROGESTERONE ACETATE 150 MG/ML IM SUSP
150.0000 mg | Freq: Once | INTRAMUSCULAR | Status: AC
  Administered 2020-07-31: 150 mg via INTRAMUSCULAR

## 2020-07-31 NOTE — Progress Notes (Signed)
Miranda Stevens here for Depo-Provera Injection. Injection administered without complication. Patient will return in 3 months for next injection between 10/16/20 and 10/30/20. Next annual visit due 02/27/21.   Marjo Bicker, RN 07/31/2020  4:21 PM

## 2020-08-04 IMAGING — DX DG CHEST 2V
2 series · 2 of 2 positions shown · non-contrast
Comparison: Chest x-ray 11/16/2009

CLINICAL DATA: Cough for 3 days.

EXAM:
CHEST - 2 VIEW

[chest pa]
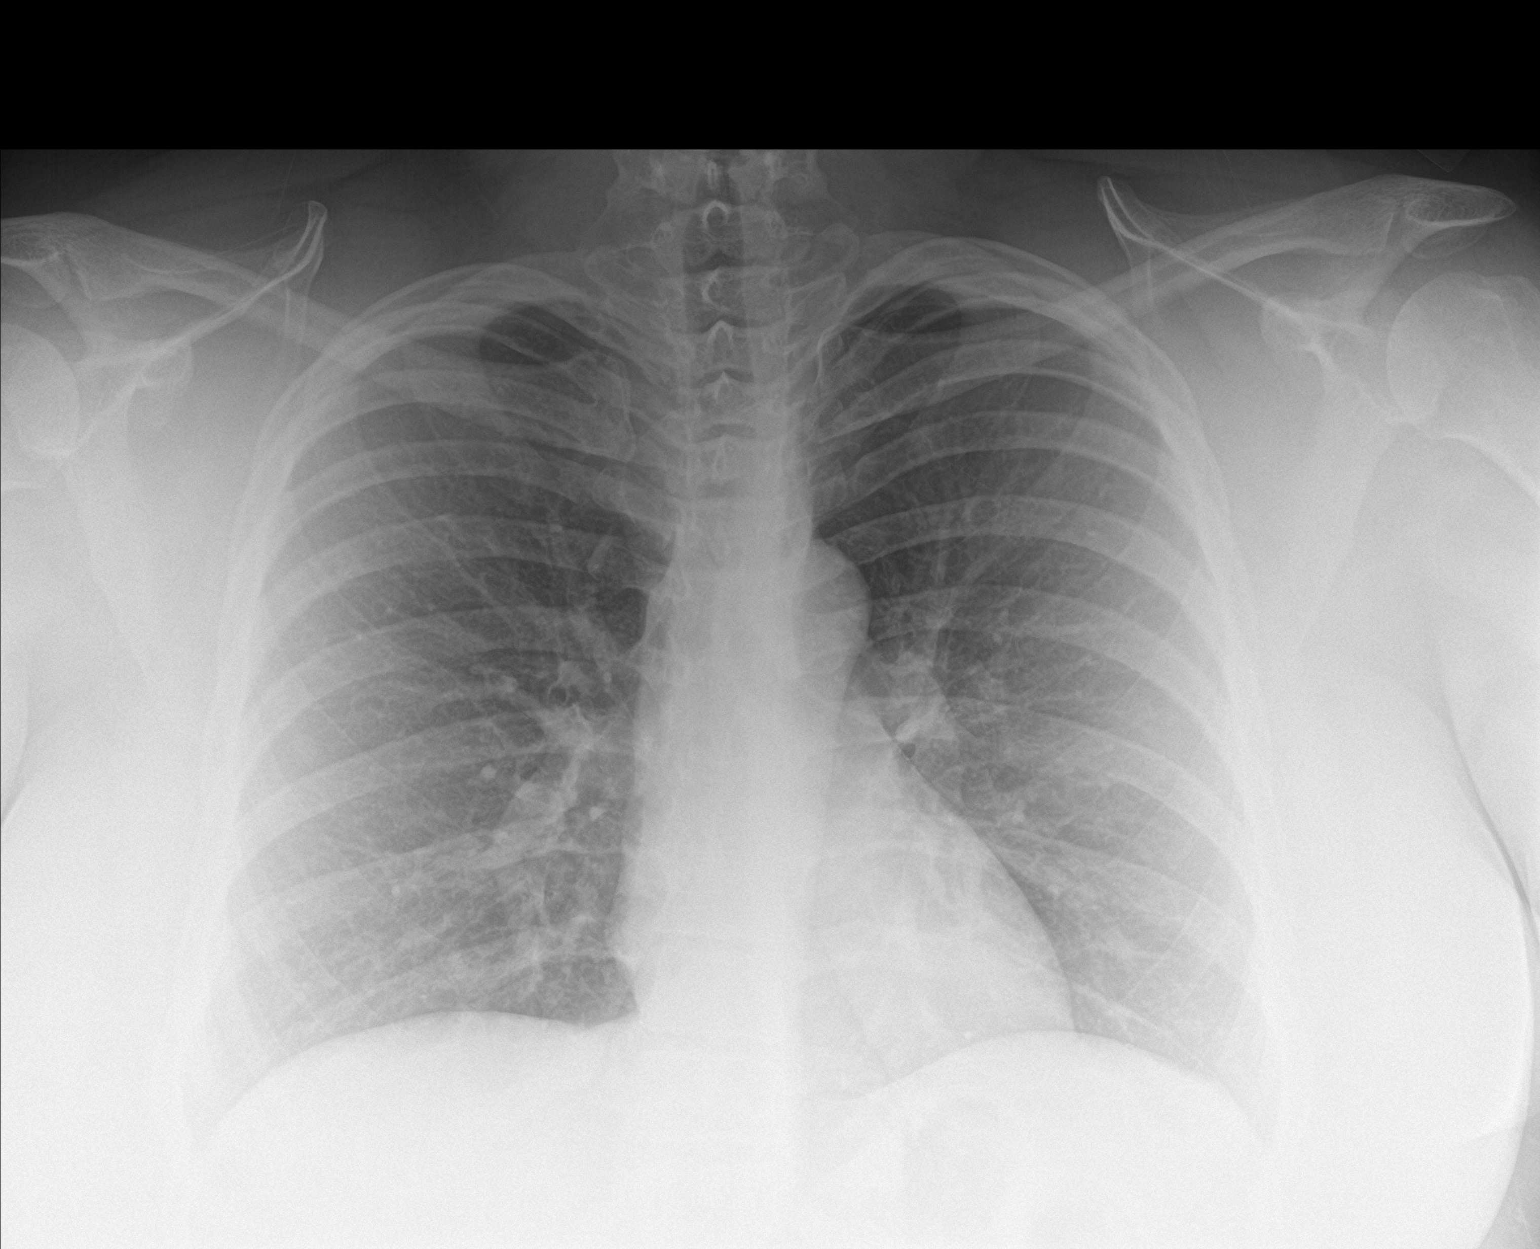

[chest lat]
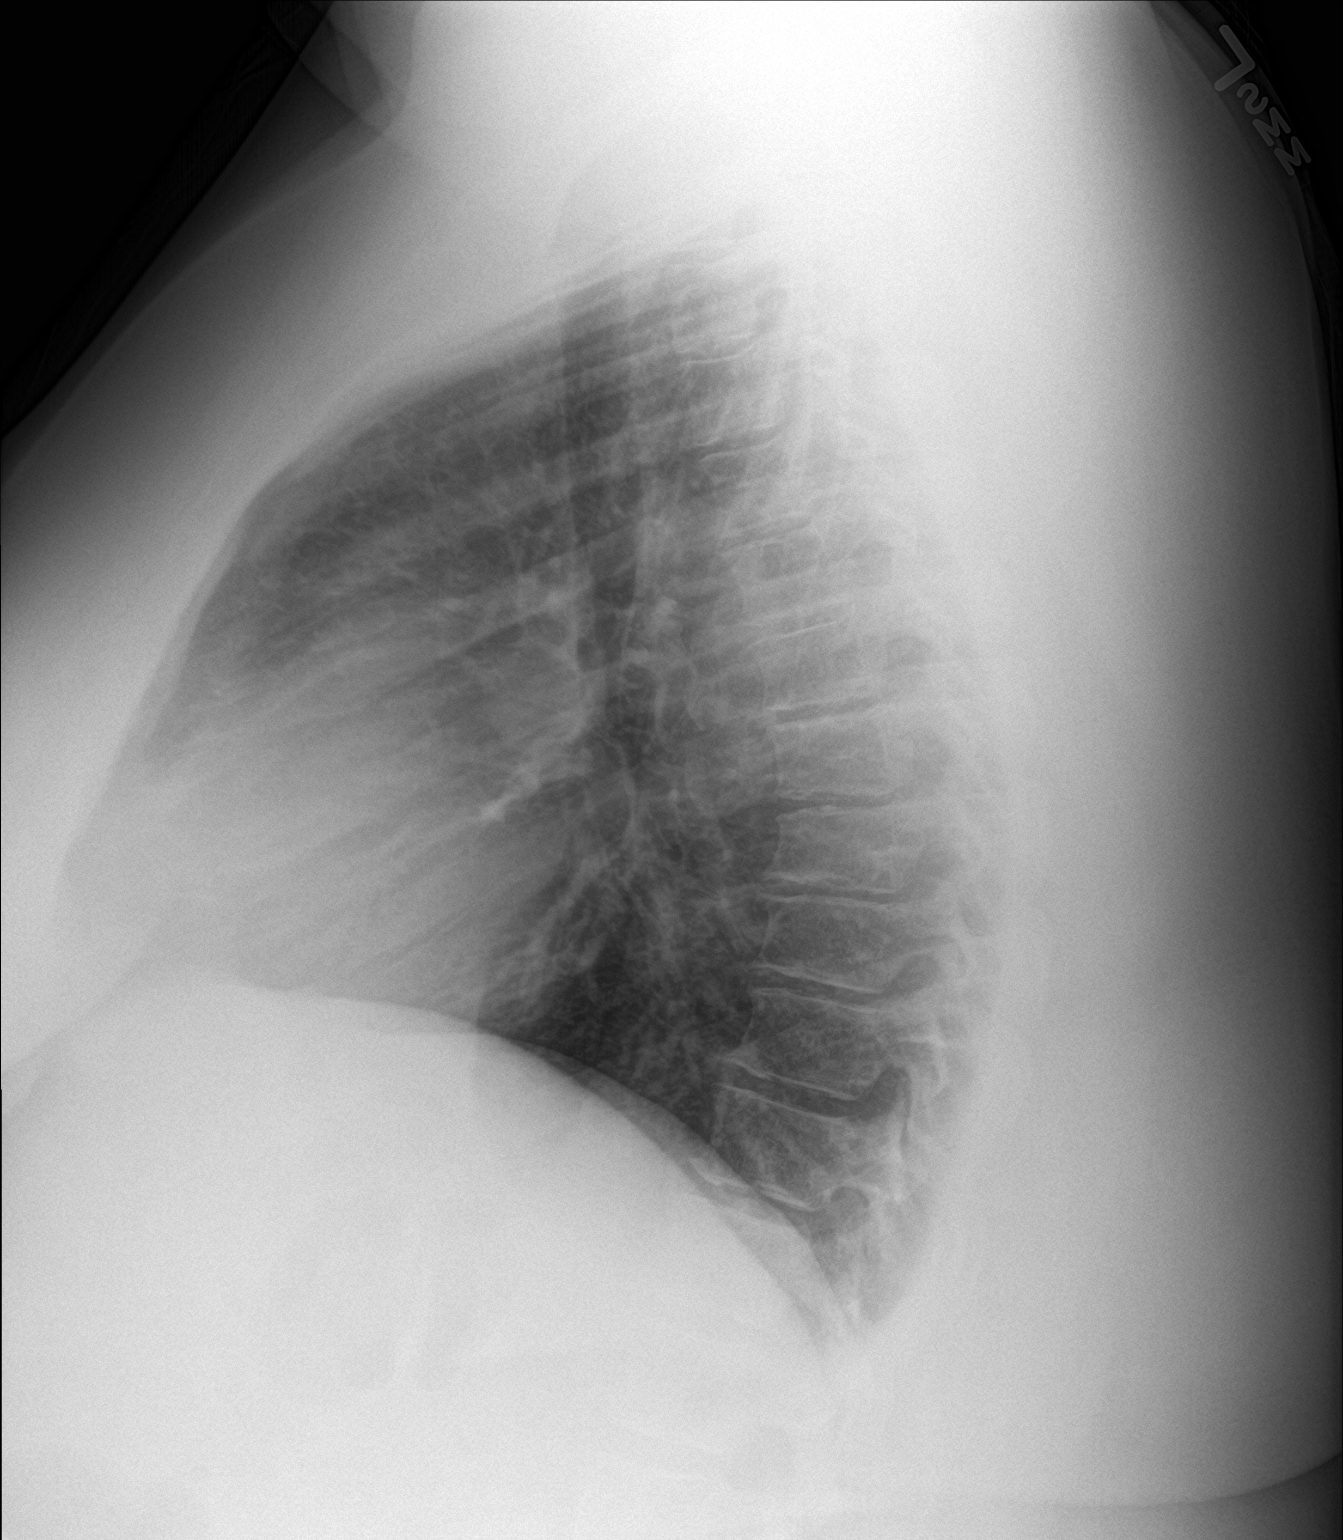

[2 of 2 positions shown; findings below may reference images not displayed]

FINDINGS: The heart size and mediastinal contours are within normal limits.
Both lungs are clear. The visualized skeletal structures are
unremarkable.
IMPRESSION: Normal chest x-ray.

## 2020-08-05 NOTE — Progress Notes (Signed)
Patient was assessed and managed by nursing staff during this encounter. I have reviewed the chart and agree with the documentation and plan. I have also made any necessary editorial changes.  Charlie Pickens, MD 08/05/2020 8:31 AM   

## 2020-08-09 ENCOUNTER — Telehealth: Payer: Self-pay | Admitting: *Deleted

## 2020-08-09 NOTE — Telephone Encounter (Signed)
Start PA for Novolog flexpen 100 unit/Ml

## 2020-08-09 NOTE — Telephone Encounter (Signed)
[place] [id number] 08-09-20--08-09-21 approved Novolog flexpen 100 unit/ml pen--injector

## 2020-08-12 ENCOUNTER — Other Ambulatory Visit: Payer: Self-pay | Admitting: *Deleted

## 2020-08-12 DIAGNOSIS — E1165 Type 2 diabetes mellitus with hyperglycemia: Secondary | ICD-10-CM

## 2020-08-12 DIAGNOSIS — Z794 Long term (current) use of insulin: Secondary | ICD-10-CM

## 2020-08-12 MED ORDER — INSULIN LISPRO (1 UNIT DIAL) 100 UNIT/ML (KWIKPEN): PEN_INJECTOR | SUBCUTANEOUS | 11 refills | Status: DC

## 2020-08-12 NOTE — Telephone Encounter (Signed)
Rx novolog fexpen--is not covered by insurance and sent Rx Humalog 100 unit/ml kwickpen --to [facility]. LVM --notified pt with medication change.

## 2020-10-10 ENCOUNTER — Other Ambulatory Visit: Payer: Self-pay | Admitting: Endocrinology

## 2020-10-11 ENCOUNTER — Telehealth: Payer: Self-pay | Admitting: Family Medicine

## 2020-10-11 DIAGNOSIS — G43809 Other migraine, not intractable, without status migrainosus: Secondary | ICD-10-CM

## 2020-10-11 NOTE — Telephone Encounter (Signed)
rizatriptan (MAXALT) 10 MG tablet  [facility] [address] [place], [place] - [address] Phone:  [phone]  Fax:  [phone]

## 2020-10-14 MED ORDER — RIZATRIPTAN BENZOATE 10 MG PO TABS: 10.0000 mg | ORAL_TABLET | ORAL | 2 refills | Status: DC | PRN

## 2020-10-14 NOTE — Addendum Note (Signed)
Addended by: Kathreen Devoid on: 10/14/2020 08:21 AM   Modules accepted: Orders

## 2020-10-14 NOTE — Telephone Encounter (Signed)
Rx sent in

## 2020-10-17 ENCOUNTER — Other Ambulatory Visit (INDEPENDENT_AMBULATORY_CARE_PROVIDER_SITE_OTHER): Payer: 59

## 2020-10-17 ENCOUNTER — Other Ambulatory Visit: Payer: Self-pay

## 2020-10-17 DIAGNOSIS — E782 Mixed hyperlipidemia: Secondary | ICD-10-CM

## 2020-10-17 DIAGNOSIS — Z794 Long term (current) use of insulin: Secondary | ICD-10-CM | POA: Diagnosis not present

## 2020-10-17 DIAGNOSIS — E1165 Type 2 diabetes mellitus with hyperglycemia: Secondary | ICD-10-CM

## 2020-10-17 LAB — BASIC METABOLIC PANEL
BUN: 13 mg/dL (ref 6–23)
CO2: 25 mEq/L (ref 19–32)
Calcium: 9.6 mg/dL (ref 8.4–10.5)
Chloride: 101 mEq/L (ref 96–112)
Creatinine, Ser: 0.92 mg/dL (ref 0.40–1.20)
GFR: 78.56 mL/min (ref >60.00)
Glucose, Bld: 262 mg/dL — ABNORMAL HIGH (ref 70–99)
Potassium: 4 mEq/L (ref 3.5–5.1)
Sodium: 135 mEq/L (ref 135–145)

## 2020-10-17 LAB — HEMOGLOBIN A1C: Hgb A1c MFr Bld: 12.6 % — ABNORMAL HIGH (ref 4.6–6.5)

## 2020-10-17 LAB — LDL CHOLESTEROL, DIRECT: Direct LDL: 88 mg/dL

## 2020-10-18 ENCOUNTER — Ambulatory Visit (INDEPENDENT_AMBULATORY_CARE_PROVIDER_SITE_OTHER): Payer: 59 | Admitting: Family Medicine

## 2020-10-18 VITALS — BP 119/81 | HR 83 | Wt 303.5 lb

## 2020-10-18 DIAGNOSIS — Z3042 Encounter for surveillance of injectable contraceptive: Secondary | ICD-10-CM

## 2020-10-18 DIAGNOSIS — B354 Tinea corporis: Secondary | ICD-10-CM | POA: Diagnosis not present

## 2020-10-18 DIAGNOSIS — L819 Disorder of pigmentation, unspecified: Secondary | ICD-10-CM

## 2020-10-18 MED ORDER — MEDROXYPROGESTERONE ACETATE 150 MG/ML IM SUSP
150.0000 mg | Freq: Once | INTRAMUSCULAR | Status: AC
Start: 2020-10-18 — End: 2020-10-18
  Administered 2020-10-18: 150 mg via INTRAMUSCULAR

## 2020-10-18 NOTE — Progress Notes (Signed)
Miranda Stevens here for Depo-Provera Injection. Injection administered without complication. Patient will return in 3 months for next injection between 01/03/21 and 01/17/21. Next annual visit due 02/27/21. Pt is concerned that she has been on Depo Provera for too long. I encouraged pt to bring up concerns at annual appt.   Pt reports ongoing yeast infection to left breast and surrounding area. Reports new light spots to left breast, expresses concern. Shawnie Pons, MD to patient room to assess.  Marjo Bicker, RN 10/18/2020  10:49 AM

## 2020-10-18 NOTE — Progress Notes (Signed)
   Subjective:    Patient ID: Miranda Stevens is a 40 y.o. female presenting with Injections  on 10/18/2020  HPI: Here initially for a nurse visit for Depo. Has h/o poorly controlled DM. Asked by nursing to see her for some derm questions. She reports change in pigmented area on breast. Also with red ring underneath her breast with some itching. She has used Nystatin in the past for same. Has Kennalog for eczema.  Review of Systems  Constitutional: Negative for chills and fever.  Respiratory: Negative for shortness of breath.   Cardiovascular: Negative for chest pain.  Gastrointestinal: Negative for abdominal pain, nausea and vomiting.  Genitourinary: Negative for dysuria.  Skin: Negative for rash.      Objective:    BP 119/81   Pulse 83   Wt (!) 303 lb 8 oz (137.7 kg)   BMI 47.53 kg/m  Physical Exam Constitutional:      General: She is not in acute distress.    Appearance: She is well-developed.  HENT:     Head: Normocephalic and atraumatic.  Eyes:     General: No scleral icterus. Cardiovascular:     Rate and Rhythm: Normal rate.  Pulmonary:     Effort: Pulmonary effort is normal.  Chest:    Abdominal:     Palpations: Abdomen is soft.  Musculoskeletal:     Cervical back: Neck supple.  Skin:    General: Skin is warm and dry.  Neurological:     Mental Status: She is alert and oriented to person, place, and time.         Assessment & Plan:   Encounter for surveillance of injectable contraceptive - Continue Depo - Plan: medroxyPROGESTERone (DEPO-PROVERA) injection 150 mg  Tinea corporis - well healed, conitnue nystatin powder prn  Hypopigmentation - likely from steroid cream residual    Return in about 3 months (around 01/18/2021), or if symptoms worsen or fail to improve, for Depo Provera.  Reva Bores 10/18/2020 11:35 AM

## 2020-10-22 ENCOUNTER — Ambulatory Visit: Payer: 59 | Admitting: Endocrinology

## 2020-10-23 NOTE — Progress Notes (Signed)
Patient ID: Miranda Stevens, female   DOB: 07-30-1980, 40 y.o.   MRN: [id number]            Reason for Appointment: Follow-up for Type 2 Diabetes  Referring physician: Grier Mitts   History of Present Illness:          Date of diagnosis of type 2 diabetes mellitus: 2013       Background history:  She was diagnosed to have diabetes as part of her evaluation for PCOS with a glucose tolerance test She was then put on metformin which she has continued Most of her diabetes care has been in [place] and no records are available Because of her sugars going up last year Amaryl was added to her and metformin continued unchanged Not clear what her A1c was at that time  Recent history:   Most recent A1c is 12.6  A1c has been previously as low as 7.4  INSULIN regimen is: [place] 100 units once daily, Humalog 16 units before meals   Non-insulin hypoglycemic drugs the patient is taking are: Metformin ER 6.7-5 g, Trulicity  Current management, blood sugar patterns and problems identified:  She has had difficulties with better messages from her freestyle libre sensors and has not used it for the last week  Also did not bring her fingerstick meter for review today  Although her Tyler Aas has been increased significantly and her fasting readings are still fairly consistently over 200 reportedly  She was told to take up to 40 units of NovoLog before meals but her old prescription indicated 16 units which she is taking  She does not think she is going off her diet usually and may not need more than 1 meal a day sometimes  Has done a little exercise on her exercise bike and is planning to start some walking also  She does not think her blood sugars are much higher after meals although blood sugar was 262 in the early afternoon even without breakfast or lunch that day  She says she gets very anxious after the Trulicity injection and has to rest, no nausea with this  Her weight is  slightly better  Meals usually 12 noon, 6 pm       Side effects from medications have been: Diarrhea from regular metformin, Invokana  Compliance with the medical regimen: Inconsistent  Prior data from freestyle libre:  CGM use % of time  60  2-week average/GV  145/19  Time in range    nine    %  % Time Above 180  45  % Time above 250  46  % Time Below 70      PRE-MEAL Fasting Lunch Dinner Bedtime Overall  Glucose range:       Averages:  275  235  241  274    POST-MEAL PC Breakfast PC Lunch PC Dinner  Glucose range:     Averages:   275  262     Dietician visit, most recent: 02/24/2018  Weight history:  Wt Readings from Last 3 Encounters:  10/24/20 (!) 308 lb 4 oz (139.8 kg)  10/18/20 (!) 303 lb 8 oz (137.7 kg)  07/31/20 (!) 310 lb 9.6 oz (140.9 kg)    Glycemic control:   Lab Results  Component Value Date   HGBA1C 12.6 (H) 10/17/2020   HGBA1C 11.5 (H) 05/14/2020   HGBA1C 10.2 (H) 02/06/2020   Lab Results  Component Value Date   MICROALBUR 0.7 10/31/2019   LDLCALC 122 (H) 10/31/2019  CREATININE 0.92 10/17/2020   Lab Results  Component Value Date   MICRALBCREAT 0.5 10/31/2019    Lab Results  Component Value Date   FRUCTOSAMINE 391 (H) 10/31/2019   FRUCTOSAMINE 388 (H) 10/25/2018   FRUCTOSAMINE 446 (H) 07/05/2018      Allergies as of 10/24/2020      Reactions   Aspirin Other (See Comments)   NO aspirin      Medication List       Accurate as of Oct 24, 2020  8:26 AM. If you have any questions, ask your nurse or doctor.        STOP taking these medications   NovoLOG FlexPen ReliOn 100 UNIT/ML FlexPen Generic drug: insulin aspart Stopped by: Elayne Snare, MD     TAKE these medications   acetaminophen 500 MG tablet Commonly known as: TYLENOL Take 1,000 mg by mouth every 6 (six) hours as needed for mild pain, moderate pain or headache. Pain   albuterol 108 (90 Base) MCG/ACT inhaler Commonly known as: VENTOLIN HFA Inhale 1-2 puffs into  the lungs every 6 (six) hours as needed for wheezing or shortness of breath.   benzonatate 200 MG capsule Commonly known as: TESSALON Take 200 mg by mouth 3 (three) times daily as needed.   cetirizine 10 MG tablet Commonly known as: ZYRTEC Take 1 tablet (10 mg total) by mouth daily.   CINNAMON PO Take 1 tablet by mouth daily.   clotrimazole 1 % cream Commonly known as: Clotrimazole Anti-Fungal Apply 1 application topically 2 (two) times daily.   fluticasone 50 MCG/ACT nasal spray Commonly known as: FLONASE Place 1 spray into both nostrils daily.   FreeStyle Libre 2 Sensor Misc APPLY EVERY 14 (FOURTEEN) DAYS   glucose blood test strip Use Onetouch verio test strips as instructed to check blood sugar 3 times daily.   insulin lispro 100 UNIT/ML KwikPen Commonly known as: HumaLOG KwikPen INJECT 16 UNITS SUBCUTANEOUSLY THREE TIMES DAILY BEFORE MEAL(S)   metFORMIN 500 MG 24 hr tablet Commonly known as: GLUCOPHAGE-XR TAKE 4 TABLETS BY MOUTH DAILY. NEED APPOINTMENT FOR FURTHER REFILLS.   montelukast 10 MG tablet Commonly known as: SINGULAIR Take 1 tablet by mouth daily.   nystatin powder Commonly known as: MYCOSTATIN/NYSTOP Apply 1 application topically 3 (three) times daily.   OneTouch Delica Lancets 91Y Misc Use onetouch delica lancets to check blood sugar 3 times daily.   OneTouch Verio Flex System w/Device Kit 1 each by Does not apply route in the morning, at noon, and at bedtime. Use onetouch verio flex to check blood sugar three times daily.   Pen Needles 32G X 6 MM Misc 1 each by Subcutaneous Infusion route as directed.   rizatriptan 10 MG tablet Commonly known as: MAXALT Take 1 tablet (10 mg total) by mouth as needed for migraine. May repeat in 2 hours if needed   rosuvastatin 40 MG tablet Commonly known as: Crestor Take 1 tablet (40 mg total) by mouth daily.   Sitz Bath Misc 1 Device by Does not apply route as needed.   Tyler Aas FlexTouch 200 UNIT/ML  FlexTouch Pen Generic drug: insulin degludec Inject 100 Units into the skin daily. Adjust dose as directed   triamcinolone cream 0.1 % Commonly known as: KENALOG Apply topically 2 (two) times daily. Apply to affected area   Trulicity 1.5 NW/2.9FA Sopn Generic drug: Dulaglutide Inject weekly       Allergies:  Allergies  Allergen Reactions  . Aspirin Other (See Comments)    NO aspirin  Past Medical History:  Diagnosis Date  . Asthma   . Diabetes mellitus without complication ([place])   . Headache(784.0)   . High cholesterol   . Migraine   . Migraine   . Pericarditis   . Polycystic disease, ovaries     Past Surgical History:  Procedure Laterality Date  . BIOPSY ENDOMETRIAL      Family History  Problem Relation Age of Onset  . Lung disease Mother   . Diabetes Mother   . Heart disease Mother   . Hyperlipidemia Father     Social History:  reports that she quit smoking about 9 years ago. Her smoking use included cigarettes. She quit after 10.00 years of use. She has never used smokeless tobacco. She reports previous alcohol use. She reports previous drug use.   Review of Systems   Lipid history: On treatment with Crestor 40 mg now instead of Lipitor With this her LDL is improved at 88  Lab Results  Component Value Date   CHOL 166 05/14/2020   CHOL 187 10/31/2019   CHOL 201 (H) 07/06/2019   Lab Results  Component Value Date   HDL 29.30 (L) 05/14/2020   HDL 26.50 (L) 10/31/2019   HDL 25.20 (L) 07/06/2019   Lab Results  Component Value Date   LDLCALC 122 (H) 10/31/2019   LDLCALC 95 03/03/2018   Lab Results  Component Value Date   TRIG 206.0 (H) 05/14/2020   TRIG 193.0 (H) 10/31/2019   TRIG 209.0 (H) 07/06/2019   Lab Results  Component Value Date   CHOLHDL 6 05/14/2020   CHOLHDL 7 10/31/2019   CHOLHDL 8 07/06/2019   Lab Results  Component Value Date   LDLDIRECT 88.0 10/17/2020   LDLDIRECT 114.0 05/14/2020   LDLDIRECT 149.0 07/06/2019             Blood pressure history: Not on any treatment  BP Readings from Last 3 Encounters:  10/24/20 120/82  10/18/20 119/81  07/31/20 108/79    Most recent eye exam was in 2019  Most recent foot exam: 9/19  Currently known complications of diabetes: None  LABS:  No visits with results within 1 Week(s) from this visit.  Latest known visit with results is:  Lab on 10/17/2020  Component Date Value Ref Range Status  . Direct LDL 10/17/2020 88.0  mg/dL Final   Optimal:  <100 mg/dLNear or Above Optimal:  100-129 mg/dLBorderline High:  130-159 mg/dLHigh:  160-189 mg/dLVery High:  >190 mg/dL  . Hgb A1c MFr Bld 10/17/2020 12.6* 4.6 - 6.5 % Final   Glycemic Control Guidelines for People with Diabetes:Non Diabetic:  <6%Goal of Therapy: <7%Additional Action Suggested:  >8%   . Sodium 10/17/2020 135  135 - 145 mEq/L Final  . Potassium 10/17/2020 4.0  3.5 - 5.1 mEq/L Final  . Chloride 10/17/2020 101  96 - 112 mEq/L Final  . CO2 10/17/2020 25  19 - 32 mEq/L Final  . Glucose, Bld 10/17/2020 262* 70 - 99 mg/dL Final  . BUN 10/17/2020 13  6 - 23 mg/dL Final  . Creatinine, Ser 10/17/2020 0.92  0.40 - 1.20 mg/dL Final  . GFR 10/17/2020 78.56  >60.00 mL/min Final   Calculated using the CKD-EPI Creatinine Equation (2021)  . Calcium 10/17/2020 9.6  8.4 - 10.5 mg/dL Final    Physical Examination:  BP 120/82   Pulse 78   Ht _0  (1.702 m)   Wt (!) 308 lb 4 oz (139.8 kg)   SpO2 99%  BMI 48.28 kg/m      ASSESSMENT:  Diabetes type 2 with morbid obesity  See history of present illness for detailed discussion of current diabetes management, blood sugar patterns and problems identified  Her A1c is still considerably higher than target at 31.5  She is on Trulicity and basal bolus insulin Appears to be significantly insulin resistant with fasting readings below 200 on 100 units of Tresiba Postprandial readings are reportedly not much higher however even with taking only about 16-32 units  of mealtime insulin daily Has difficulty losing weight again Previously had difficulty with tolerating SGLT2 drugs because of recurrent yeast infections   Lipids: LDL now under 100 with Crestor 40 which she is tolerating and can continue    PLAN:    She will switch from Humalog to Humulin R U-500  Discussed the differences between regular insulin and analog insulin, duration of action, timing of injection  She will need to take this 3 times a day starting with 20 units in the morning, taking 40 at lunch she is getting any carbs and 60 at dinnertime  She will also go up weekly by 10 units until morning sugars are below about 140 but conversely may reduce her morning sugars are low  Continue 100 units of Tresiba for now  Since she cannot tolerate Trulicity well we will try to switch her to Ozempic   Discussed how Ozempic works, showed her the injection device and discussed titration schedule  She can start with 0.25 for the first 2 weeks and then go to 0.5 weekly  Will check to see if she can get coverage for Dexcom since freestyle Elenor Legato is given her repeated error messages   There are no Patient Instructions on file for this visit.     Elayne Snare 10/24/2020, 8:26 AM   Note: This office note was prepared with Dragon voice recognition system technology. Any transcriptional errors that result from this process are unintentional.

## 2020-10-24 ENCOUNTER — Ambulatory Visit (INDEPENDENT_AMBULATORY_CARE_PROVIDER_SITE_OTHER): Payer: 59 | Admitting: Endocrinology

## 2020-10-24 ENCOUNTER — Encounter: Payer: Self-pay | Admitting: Endocrinology

## 2020-10-24 ENCOUNTER — Other Ambulatory Visit: Payer: Self-pay

## 2020-10-24 VITALS — BP 120/82 | HR 78 | Ht 67.0 in | Wt 308.2 lb

## 2020-10-24 DIAGNOSIS — E1165 Type 2 diabetes mellitus with hyperglycemia: Secondary | ICD-10-CM | POA: Diagnosis not present

## 2020-10-24 DIAGNOSIS — Z794 Long term (current) use of insulin: Secondary | ICD-10-CM | POA: Diagnosis not present

## 2020-10-24 DIAGNOSIS — E782 Mixed hyperlipidemia: Secondary | ICD-10-CM | POA: Diagnosis not present

## 2020-10-24 MED ORDER — DEXCOM G6 SENSOR MISC: 3 refills | Status: DC

## 2020-10-24 MED ORDER — OZEMPIC (0.25 OR 0.5 MG/DOSE) 2 MG/1.5ML ~~LOC~~ SOPN: 0.5000 mg | PEN_INJECTOR | SUBCUTANEOUS | 2 refills | Status: DC

## 2020-10-24 MED ORDER — HUMULIN R U-500 KWIKPEN 500 UNIT/ML ~~LOC~~ SOPN: PEN_INJECTOR | SUBCUTANEOUS | 1 refills | Status: DC

## 2020-10-24 MED ORDER — DEXCOM G6 TRANSMITTER MISC: 1.0000 | Freq: Once | 1 refills | Status: AC

## 2020-10-24 NOTE — Patient Instructions (Signed)
Stop Humalog and start taking Humulin R as follows 20 units on waking up, 40 units before lunch and 60 units before dinner If your blood sugar at bedtime or in the morning is still over 200 increase the supper dose by 10 units weekly  If not eating any carbohydrates at lunch take 20 units at midday Continue Tresiba 100 units  Stop Trulicity and start OZEMPIC injections by dialing 0.25 mg on the pen as shown once weekly on the same day of the week.   You may inject in the sides of the stomach, outer thigh or arm as indicated in the brochure given. If you have any difficulties using the pen see the video at [email or web address]  You will feel fullness of the stomach with starting the medication and should try to keep the portions at meals small.  You may experience nausea in the first few days which usually gets better over time    2 weeks later increase the dose to 0.5 mg weekly  If you have any questions or persistent side effects please call the office   You may also talk to a nurse educator with [employer] at [phone] Useful website: [email or web address]

## 2020-10-25 ENCOUNTER — Telehealth: Payer: Self-pay | Admitting: Endocrinology

## 2020-10-25 NOTE — Telephone Encounter (Signed)
Pt calling in a voiced that dexcom g6 is part of pt insurance and would like to let the provider know. Also needs to know if there are alternatives for  ozempic   Because ozempic is not covered on pt insurance. Pt would like a call back

## 2020-10-25 NOTE — Telephone Encounter (Signed)
Tell her to continue Trulicity

## 2020-10-25 NOTE — Telephone Encounter (Signed)
Please advise 

## 2020-10-29 MED ORDER — DEXCOM G6 TRANSMITTER MISC: 3 refills | Status: AC

## 2020-10-29 NOTE — Telephone Encounter (Signed)
Spoken to patient and notified Dr Kumar's comments. Verbalized understanding.   

## 2020-12-04 ENCOUNTER — Other Ambulatory Visit: Payer: 59

## 2020-12-06 ENCOUNTER — Ambulatory Visit: Payer: 59 | Admitting: Endocrinology

## 2021-01-03 ENCOUNTER — Other Ambulatory Visit: Payer: Self-pay

## 2021-01-03 ENCOUNTER — Ambulatory Visit (INDEPENDENT_AMBULATORY_CARE_PROVIDER_SITE_OTHER): Payer: 59

## 2021-01-03 VITALS — BP 118/77 | HR 68 | Wt 291.7 lb

## 2021-01-03 DIAGNOSIS — Z3042 Encounter for surveillance of injectable contraceptive: Secondary | ICD-10-CM | POA: Diagnosis not present

## 2021-01-03 MED ORDER — MEDROXYPROGESTERONE ACETATE 150 MG/ML IM SUSP
150.0000 mg | Freq: Once | INTRAMUSCULAR | Status: AC
  Administered 2021-01-03: 150 mg via INTRAMUSCULAR

## 2021-01-03 NOTE — Progress Notes (Signed)
Miranda Stevens here for Depo-Provera Injection. Reports improvement of irritation below breasts and in groin area. Pt continues to use Nystatin powder clotrimazole cream on these areas. States lighter areas of skin on breasts are still present, but are not bothersome in any way. Reports vaginal spotting over the last month; pt does not find this uncomfortable and would like to continue Depo Provera. Injection administered without complication.  Patient will return in 3 months for next injection between 03/21/21 and 04/04/21. Next annual visit due September 2022. Patient plans to schedule annual with next Depo injection. Discussed mammogram will be due next year.  Marjo Bicker, RN 01/03/2021  8:34 AM

## 2021-02-20 ENCOUNTER — Other Ambulatory Visit: Payer: Self-pay | Admitting: Family Medicine

## 2021-02-20 DIAGNOSIS — G43809 Other migraine, not intractable, without status migrainosus: Secondary | ICD-10-CM

## 2021-02-27 ENCOUNTER — Other Ambulatory Visit: Payer: Self-pay | Admitting: Endocrinology

## 2021-03-11 ENCOUNTER — Ambulatory Visit: Payer: 59 | Admitting: Family Medicine

## 2021-03-28 ENCOUNTER — Other Ambulatory Visit: Payer: Self-pay

## 2021-03-28 ENCOUNTER — Ambulatory Visit (INDEPENDENT_AMBULATORY_CARE_PROVIDER_SITE_OTHER): Payer: BC Managed Care – PPO

## 2021-03-28 VITALS — BP 138/76 | HR 79

## 2021-03-28 DIAGNOSIS — Z3042 Encounter for surveillance of injectable contraceptive: Secondary | ICD-10-CM

## 2021-03-28 MED ORDER — MEDROXYPROGESTERONE ACETATE 150 MG/ML IM SUSP
150.0000 mg | Freq: Once | INTRAMUSCULAR | Status: AC
  Administered 2021-03-28: 150 mg via INTRAMUSCULAR

## 2021-03-28 NOTE — Progress Notes (Signed)
Patient was assessed and managed by nursing staff during this encounter. I have reviewed the chart and agree with the documentation and plan. I have also made any necessary editorial changes.  Warden Fillers, MD 03/28/2021 12:16 PM

## 2021-03-28 NOTE — Progress Notes (Signed)
Miranda Stevens here for Depo-Provera Injection. Injection administered without complication. Patient will return in 3 months for next injection between January 13 and January 25. Next annual visit will be scheduled with Depo Provera.    Ralene Bathe, RN 03/28/2021  11:31 AM

## 2021-04-07 ENCOUNTER — Telehealth: Payer: Self-pay | Admitting: Endocrinology

## 2021-04-07 ENCOUNTER — Other Ambulatory Visit: Payer: Self-pay | Admitting: Endocrinology

## 2021-04-07 DIAGNOSIS — E1165 Type 2 diabetes mellitus with hyperglycemia: Secondary | ICD-10-CM

## 2021-04-07 NOTE — Telephone Encounter (Signed)
MEDICATION: insulin degludec (TRESIBA FLEXTOUCH) 200 UNIT/ML FlexTouch Pen  AND  insulin regular human CONCENTRATED (HUMULIN R U-500 KWIKPEN) 500 UNIT/ML kwikpen  AND  metFORMIN (GLUCOPHAGE-XR) 500 MG 24 hr tablet  PHARMACY:   [facility] [address] [place], [place] - [address] Phone:  [phone]  Fax:  [phone]      HAS THE PATIENT CONTACTED THEIR PHARMACY?  Yes-PHARM told Patient to call Dr. Lucianne Muss  IS THIS A 90 DAY SUPPLY : Yes-usually  IS PATIENT OUT OF MEDICATION: No  IF NOT; HOW MUCH IS LEFT: Almost of of Tresiba completely-Humalin-1 pen left-Metformin-Approx 3 days  LAST APPOINTMENT DATE: @11 /11/2020  NEXT APPOINTMENT DATE:@12 /22/2022  DO WE HAVE YOUR PERMISSION TO LEAVE A DETAILED MESSAGE?: Yes  OTHER COMMENTS:    **Let patient know to contact pharmacy at the end of the day to make sure medication is ready. **  ** Please notify patient to allow 48-72 hours to process**  **Encourage patient to contact the pharmacy for refills or they can request refills through [facility]**

## 2021-04-08 MED ORDER — METFORMIN HCL ER 500 MG PO TB24: ORAL_TABLET | ORAL | 0 refills | Status: DC

## 2021-04-08 MED ORDER — HUMULIN R U-500 KWIKPEN 500 UNIT/ML ~~LOC~~ SOPN: PEN_INJECTOR | SUBCUTANEOUS | 1 refills | Status: DC

## 2021-04-08 MED ORDER — TRESIBA FLEXTOUCH 200 UNIT/ML ~~LOC~~ SOPN: 100.0000 [IU] | PEN_INJECTOR | Freq: Every day | SUBCUTANEOUS | 1 refills | Status: DC

## 2021-04-08 NOTE — Telephone Encounter (Signed)
Rx sent in to pharmacy. 

## 2021-04-23 ENCOUNTER — Other Ambulatory Visit: Payer: Self-pay | Admitting: Endocrinology

## 2021-05-22 ENCOUNTER — Other Ambulatory Visit: Payer: BC Managed Care – PPO

## 2021-05-28 ENCOUNTER — Ambulatory Visit: Payer: BC Managed Care – PPO | Admitting: Endocrinology

## 2021-06-13 ENCOUNTER — Ambulatory Visit: Payer: BC Managed Care – PPO

## 2021-07-20 ENCOUNTER — Other Ambulatory Visit: Payer: Self-pay | Admitting: Endocrinology

## 2021-07-20 DIAGNOSIS — Z794 Long term (current) use of insulin: Secondary | ICD-10-CM

## 2021-07-20 DIAGNOSIS — E1165 Type 2 diabetes mellitus with hyperglycemia: Secondary | ICD-10-CM

## 2021-07-25 ENCOUNTER — Other Ambulatory Visit: Payer: Self-pay | Admitting: Endocrinology

## 2021-07-25 DIAGNOSIS — E1165 Type 2 diabetes mellitus with hyperglycemia: Secondary | ICD-10-CM

## 2021-10-29 ENCOUNTER — Emergency Department (HOSPITAL_COMMUNITY)
Admission: EM | Admit: 2021-10-29 | Discharge: 2021-10-29 | Disposition: A | Discharge status: Home / Self Care | Payer: BC Managed Care – PPO | Attending: Emergency Medicine | Admitting: Emergency Medicine

## 2021-10-29 ENCOUNTER — Other Ambulatory Visit: Payer: Self-pay

## 2021-10-29 DIAGNOSIS — Z7984 Long term (current) use of oral hypoglycemic drugs: Secondary | ICD-10-CM | POA: Insufficient documentation

## 2021-10-29 DIAGNOSIS — R112 Nausea with vomiting, unspecified: Secondary | ICD-10-CM

## 2021-10-29 DIAGNOSIS — N3000 Acute cystitis without hematuria: Secondary | ICD-10-CM | POA: Insufficient documentation

## 2021-10-29 DIAGNOSIS — R197 Diarrhea, unspecified: Secondary | ICD-10-CM | POA: Insufficient documentation

## 2021-10-29 DIAGNOSIS — E119 Type 2 diabetes mellitus without complications: Secondary | ICD-10-CM | POA: Insufficient documentation

## 2021-10-29 DIAGNOSIS — Z794 Long term (current) use of insulin: Secondary | ICD-10-CM | POA: Insufficient documentation

## 2021-10-29 LAB — CBC WITH DIFFERENTIAL/PLATELET
Abs Immature Granulocytes: 0.01 10*3/uL (ref 0.00–0.07)
Basophils Absolute: 0 10*3/uL (ref 0.0–0.1)
Basophils Relative: 0 %
Eosinophils Absolute: 0.4 10*3/uL (ref 0.0–0.5)
Eosinophils Relative: 6 %
HCT: 45.3 % (ref 36.0–46.0)
Hemoglobin: 14.5 g/dL (ref 12.0–15.0)
Immature Granulocytes: 0 %
Lymphocytes Relative: 28 %
Lymphs Abs: 1.9 10*3/uL (ref 0.7–4.0)
MCH: 28.7 pg (ref 26.0–34.0)
MCHC: 32 g/dL (ref 30.0–36.0)
MCV: 89.5 fL (ref 80.0–100.0)
Monocytes Absolute: 0.9 10*3/uL (ref 0.1–1.0)
Monocytes Relative: 13 %
Neutro Abs: 3.6 10*3/uL (ref 1.7–7.7)
Neutrophils Relative %: 53 %
Platelets: 153 10*3/uL (ref 150–400)
RBC: 5.06 MIL/uL (ref 3.87–5.11)
RDW: 12.8 % (ref 11.5–15.5)
WBC: 6.7 10*3/uL (ref 4.0–10.5)
nRBC: 0 % (ref 0.0–0.2)

## 2021-10-29 LAB — URINALYSIS, ROUTINE W REFLEX MICROSCOPIC
Bilirubin Urine: NEGATIVE
Glucose, UA: >=500 mg/dL — AB
Hgb urine dipstick: NEGATIVE
Ketones, ur: 20 mg/dL — AB
Nitrite: NEGATIVE
Protein, ur: 30 mg/dL — AB
Specific Gravity, Urine: 1.041 — ABNORMAL HIGH (ref 1.005–1.030)
pH: 5 (ref 5.0–8.0)

## 2021-10-29 LAB — COMPREHENSIVE METABOLIC PANEL
ALT: 23 U/L (ref 0–44)
AST: 15 U/L (ref 15–41)
Albumin: 4.2 g/dL (ref 3.5–5.0)
Alkaline Phosphatase: 70 U/L (ref 38–126)
Anion gap: 9 (ref 5–15)
BUN: 7 mg/dL (ref 6–20)
CO2: 23 mmol/L (ref 22–32)
Calcium: 9.3 mg/dL (ref 8.9–10.3)
Chloride: 110 mmol/L (ref 98–111)
Creatinine, Ser: 0.9 mg/dL (ref 0.44–1.00)
GFR, Estimated: >60 mL/min (ref >60)
Glucose, Bld: 133 mg/dL — ABNORMAL HIGH (ref 70–99)
Potassium: 3.6 mmol/L (ref 3.5–5.1)
Sodium: 142 mmol/L (ref 135–145)
Total Bilirubin: 0.7 mg/dL (ref 0.3–1.2)
Total Protein: 7.2 g/dL (ref 6.5–8.1)

## 2021-10-29 LAB — LIPASE, BLOOD: Lipase: 28 U/L (ref 11–51)

## 2021-10-29 MED ORDER — ONDANSETRON 8 MG PO TBDP
8.0000 mg | ORAL_TABLET | Freq: Three times a day (TID) | ORAL | 0 refills | Status: AC | PRN
Start: 2021-10-29 — End: 2021-11-05

## 2021-10-29 MED ORDER — ONDANSETRON 4 MG PO TBDP
4.0000 mg | ORAL_TABLET | Freq: Once | ORAL | Status: AC
Start: 2021-10-29 — End: 2021-10-29
  Administered 2021-10-29: 4 mg via ORAL
  Filled 2021-10-29: qty 1

## 2021-10-29 MED ORDER — NITROFURANTOIN MONOHYD MACRO 100 MG PO CAPS: 100.0000 mg | ORAL_CAPSULE | Freq: Two times a day (BID) | ORAL | 0 refills | Status: AC

## 2021-10-29 MED ORDER — ONDANSETRON HCL 4 MG/2ML IJ SOLN: 4.0000 mg | Freq: Once | INTRAMUSCULAR | Status: DC

## 2021-10-29 MED ORDER — SODIUM CHLORIDE 0.9 % IV BOLUS
1000.0000 mL | Freq: Once | INTRAVENOUS | Status: AC
Start: 2021-10-29 — End: 2021-10-29
  Administered 2021-10-29: 1000 mL via INTRAVENOUS

## 2021-10-29 NOTE — ED Triage Notes (Signed)
Pt here for N/V/D since Saturday. Pt reports upper abd pain that's an intermittent aching, nausea w/ dry heaves and diarrhea. Pt reports not being able to tolerate anything PO. Pt is diabetic and has not taken medication for the last 2 days due to not being able to eat.

## 2021-10-29 NOTE — Discharge Instructions (Signed)
Urinary Tract Infection: I have prescribed you antibiotics. Please take as prescribed.   Nausea, Vomiting, and Diarrhea: Your labs were reassuring today. Please follow up with your PCP regarding medication management. I have prescribed you zofran as needed for nausea.   Please return if you develop fevers, continued inability to tolerate fluids, or worsening symptoms.

## 2021-10-29 NOTE — ED Provider Notes (Signed)
[facility] Provider Note   CSN: [id number] Arrival date & time: 10/29/21  1200     History PMH: Diabetes, Obesity, HLD, migraines, pericarditis, PCOS Chief Complaint  Patient presents with   N/V/D    Miranda Stevens is a 41 y.o. female. Presents to the emergency department the chief complaint of nausea, vomiting, and diarrhea.  She says the symptoms been ongoing since Saturday.  She states that she had both nausea and vomiting about 3 times daily that lasted until yesterday.  Today she has not had either, but she still feels very nauseous.  Every time she eats or drinks, she feels like she throws up.  She says her vomit is usually liquid.  She denies any fevers or chills.  Denies abdominal pain.  She says that she has been on both Manjaro and Jardiance for the past month for her obesity and diabetes.  Her A1c was 12.  She says she gets weekly injections, and that she always has the same symptoms for 4 to 5 days after she gets her injection.  She does not think that this time is any different than the other times.  States that she is concerned because she has been able to eat or drink and is still taking a little a large portion of her insulin.  Her PCP also recommended she come to the emergency department to make sure she is not dehydrated.  HPI     Home Medications Prior to Admission medications   Medication Sig Start Date End Date Taking? Authorizing Provider  nitrofurantoin, macrocrystal-monohydrate, (MACROBID) 100 MG capsule Take 1 capsule (100 mg total) by mouth 2 (two) times daily for 5 days. 10/29/21 11/03/21 Yes Iridessa Harrow, Adora Fridge, PA-C  ondansetron (ZOFRAN-ODT) 8 MG disintegrating tablet Take 1 tablet (8 mg total) by mouth every 8 (eight) hours as needed for up to 7 days for nausea or vomiting. 10/29/21 11/05/21 Yes Shagun Wordell, Adora Fridge, PA-C  acetaminophen (TYLENOL) 500 MG tablet Take 1,000 mg by mouth every 6 (six) hours as needed for mild  pain, moderate pain or headache. Pain    [provider]  albuterol (VENTOLIN HFA) 108 (90 Base) MCG/ACT inhaler Inhale 1-2 puffs into the lungs every 6 (six) hours as needed for wheezing or shortness of breath. 06/06/20   Billie Ruddy, MD  benzonatate (TESSALON) 200 MG capsule Take 200 mg by mouth 3 (three) times daily as needed. 09/27/20   [provider]  Blood Glucose Monitoring Suppl ([place]) w/Device KIT 1 each by Does not apply route in the morning, at noon, and at bedtime. Use onetouch verio flex to check blood sugar three times daily.    [provider]  cetirizine (ZYRTEC) 10 MG tablet Take 1 tablet (10 mg total) by mouth daily. 06/06/20   Billie Ruddy, MD  CINNAMON PO Take 1 tablet by mouth daily.     [provider]  clotrimazole (CLOTRIMAZOLE ANTI-FUNGAL) 1 % cream Apply 1 application topically 2 (two) times daily. 02/28/20   Tresea Mall, CNM  Continuous Blood Gluc Sensor (FREESTYLE LIBRE 2 SENSOR) MISC CHANGE SENSOR EVERY 14 DAYS. NEED TO SCHEDULE FOLLOW UP APPOINTMENT. 04/26/21   Elayne Snare, MD  Continuous Blood Gluc Transmit (DEXCOM G6 TRANSMITTER) MISC Use as instructed to check blood sugar daily 10/29/20   Elayne Snare, MD  Dulaglutide (TRULICITY) 1.5 KA/7.6OT SOPN Inject 1.5 mg into the skin once a week.    [provider]  fluticasone (FLONASE) 50 MCG/ACT nasal spray Place 1 spray into both nostrils daily. 06/06/20   Billie Ruddy, MD  glucose blood test strip Use Onetouch verio test strips as instructed to check blood sugar 3 times daily. 06/21/20   Elayne Snare, MD  insulin degludec (TRESIBA FLEXTOUCH) 200 UNIT/ML FlexTouch Pen Inject 100 Units into the skin daily. Adjust dose as directed 04/08/21   Elayne Snare, MD  Insulin Pen Needle (PEN NEEDLES) 32G X 6 MM MISC 1 each by Subcutaneous Infusion route as directed. 06/21/20   Elayne Snare, MD  insulin regular human CONCENTRATED (HUMULIN R U-500 KWIKPEN) 500 UNIT/ML  KwikPen 20 units on waking up, 40 units, 30 minutes before lunch and 60 units, 30 minutes before dinner 04/08/21   Elayne Snare, MD  metFORMIN (GLUCOPHAGE-XR) 500 MG 24 hr tablet TAKE 4 TABLETS BY MOUTH DAILY. NEED APPOINTMENT FOR FURTHER REFILLS. 04/08/21   Elayne Snare, MD  Misc. Devices (SITZ BATH) MISC 1 Device by Does not apply route as needed. 02/28/20   Marcille Buffy D, CNM  montelukast (SINGULAIR) 10 MG tablet Take 1 tablet by mouth daily. 09/27/20   [provider]  nystatin (MYCOSTATIN/NYSTOP) powder Apply 1 application topically 3 (three) times daily. 02/28/20   Tresea Mall, CNM  OneTouch Delica Lancets 92K MISC Use onetouch delica lancets to check blood sugar 3 times daily. 10/03/19   Elayne Snare, MD  rizatriptan (MAXALT) 10 MG tablet TAKE 1 TABLET BY MOUTH AS NEEDED FOR MIGRAINE, MAY REPEAT IN 2 HOURS IF NEEDED. 02/21/21   Billie Ruddy, MD  rosuvastatin (CRESTOR) 40 MG tablet Take 1 tablet (40 mg total) by mouth daily. 06/21/20   Elayne Snare, MD      Allergies    Aspirin    Review of Systems   Review of Systems  Constitutional:  Negative for chills and fever.  Respiratory:  Negative for shortness of breath.   Cardiovascular:  Negative for chest pain and leg swelling.  Gastrointestinal:  Positive for diarrhea, nausea and vomiting. Negative for abdominal distention, abdominal pain, blood in stool and constipation.  Genitourinary:  Negative for decreased urine volume.  Musculoskeletal:  Positive for myalgias.  All other systems reviewed and are negative.  Physical Exam Updated Vital Signs BP (!) 122/104   Pulse 66   Temp 98.3 F (36.8 C)   Resp 13   SpO2 100%  Physical Exam Vitals and nursing note reviewed.  Constitutional:      General: She is not in acute distress.    Appearance: Normal appearance. She is obese. She is not ill-appearing, toxic-appearing or diaphoretic.  HENT:     Head: Normocephalic and atraumatic.     Nose: No nasal deformity.      Mouth/Throat:     Lips: Pink. No lesions.     Mouth: Mucous membranes are moist. No injury, lacerations, oral lesions or angioedema.     Pharynx: Oropharynx is clear. Uvula midline. No pharyngeal swelling, oropharyngeal exudate, posterior oropharyngeal erythema or uvula swelling.  Eyes:     General: Gaze aligned appropriately. No scleral icterus.       Right eye: No discharge.        Left eye: No discharge.     Conjunctiva/sclera: Conjunctivae normal.     Right eye: Right conjunctiva is not injected. No exudate or hemorrhage.    Left eye: Left conjunctiva is not injected. No exudate or hemorrhage. Cardiovascular:     Rate and Rhythm: Normal rate and regular rhythm.  Pulses: Normal pulses.          Radial pulses are 2+ on the right side and 2+ on the left side.       Dorsalis pedis pulses are 2+ on the right side and 2+ on the left side.     Heart sounds: Normal heart sounds, S1 normal and S2 normal. Heart sounds not distant. No murmur heard.   No friction rub. No gallop. No S3 or S4 sounds.  Pulmonary:     Effort: Pulmonary effort is normal. No accessory muscle usage or respiratory distress.     Breath sounds: Normal breath sounds. No stridor. No wheezing, rhonchi or rales.  Chest:     Chest wall: No tenderness.  Abdominal:     General: Abdomen is flat. There is no distension.     Palpations: Abdomen is soft. There is no mass or pulsatile mass.     Tenderness: There is no abdominal tenderness. There is no right CVA tenderness, left CVA tenderness, guarding or rebound.  Musculoskeletal:     Right lower leg: No edema.     Left lower leg: No edema.  Skin:    General: Skin is warm and dry.     Coloration: Skin is not jaundiced or pale.     Findings: No bruising, erythema, lesion or rash.  Neurological:     General: No focal deficit present.     Mental Status: She is alert and oriented to person, place, and time.     GCS: GCS eye subscore is 4. GCS verbal subscore is 5. GCS motor  subscore is 6.  Psychiatric:        Mood and Affect: Mood normal.        Behavior: Behavior normal. Behavior is cooperative.    ED Results / Procedures / Treatments   Labs (all labs ordered are listed, but only abnormal results are displayed) Labs Reviewed  COMPREHENSIVE METABOLIC PANEL - Abnormal; Notable for the following components:      Result Value   Glucose, Bld 133 (*)    All other components within normal limits  URINALYSIS, ROUTINE W REFLEX MICROSCOPIC - Abnormal; Notable for the following components:   Color, Urine AMBER (*)    APPearance CLOUDY (*)    Specific Gravity, Urine 1.041 (*)    Glucose, UA >=500 (*)    Ketones, ur 20 (*)    Protein, ur 30 (*)    Leukocytes,Ua TRACE (*)    Bacteria, UA MANY (*)    All other components within normal limits  CBC WITH DIFFERENTIAL/PLATELET  LIPASE, BLOOD    EKG None  Radiology No results found.  Procedures Procedures  This patient was on telemetry or cardiac monitoring during their time in the ED.    Medications Ordered in ED Medications  ondansetron (ZOFRAN-ODT) disintegrating tablet 4 mg (4 mg Oral Given 10/29/21 1226)  sodium chloride 0.9 % bolus 1,000 mL (0 mLs Intravenous Stopped 10/29/21 1502)    ED Course/ Medical Decision Making/ A&P                            Medical Decision Making Risk Prescription drug management.    MDM  This is a 41 y.o. female who presents to the ED with nausea, vomiting, and diarrhea for four days.   My Impression, Plan, and ED Course: Patient appears well with normal vitals.  She is afebrile.  She is hemodynamically stable.  She is  here with nausea, vomiting, and diarrhea for 4 days.  This is in the setting of a recent Manjaro injection that she reports has been a recurring thing. She is on several diabetes medications and has not tolerated p.o. we will obtain blood sugar here as well as get chemistry to assess for DKA.  This seems consistent with side effect from the  medications that she is receiving since it is a recurring issues. I recommend that she consult with her PCP regarding dose changes from this. We will check her labs, give fluids, and try to control symptoms here.  Abdominal exam is fairly benign and I do not think we need to get CT imaging. Plan to reassess after interventions.   I personally ordered, reviewed, and interpreted all laboratory work and imaging and agree with radiologist interpretation. Results interpreted below:  CBC without leukocytosis or anemia CMP with glu 133, no acidosis, no AKI, no LFT abnormality, No electrolyte abnormality.  Lipase 28 UA with large glucose, 20 ketones, 30 protein, many bacteria. Findings may represent dehydration. Patient does express symptoms of frequent urination so will treat for UTI.  Labs are reassuring otherwise. Patient improvement after fluids an zofran. Tolerated PO. Symptoms are likely reflective of medication side effects. Patient does not meet admission requirements.  Patient feels ready for discharge. She should f/u with PCP.     Charting Requirements Additional history is obtained from:  Independent historian External Records from outside source obtained and reviewed including: Reviewed recent DM visit at PCP. Reviewed prior labs. Social Determinants of Health:  none Pertinant PMH that complicates patient's illness: DM  Patient Care Problems that were addressed during this visit: - Nausea, Vomiting, and Diarrhea: Acute illness with systemic symptoms - Urinary Tract Infection: Acute illness Medications given in ED: IVF, zofran Reevaluation of the patient after these medicines showed that the patient improved I have reviewed home medications and made changes accordingly.  Disposition: discharge  This is a supervised visit with my attending physician, Dr. Langston Masker. We have discussed this patient and they have altered the plan as needed.  Portions of this note were generated with Geographical information systems officer. Dictation errors may occur despite best attempts at proofreading.     Final Clinical Impression(s) / ED Diagnoses Final diagnoses:  Nausea vomiting and diarrhea  Acute cystitis without hematuria    Rx / DC Orders ED Discharge Orders          Ordered    nitrofurantoin, macrocrystal-monohydrate, (MACROBID) 100 MG capsule  2 times daily        10/29/21 1505    ondansetron (ZOFRAN-ODT) 8 MG disintegrating tablet  Every 8 hours PRN        10/29/21 1505              Adolphus Birchwood, PA-C 10/29/21 1628    Wyvonnia Dusky, MD 10/30/21 1027

## 2021-10-29 NOTE — ED Provider Triage Note (Signed)
Emergency Medicine Provider Triage Evaluation Note  Miranda Stevens , a 41 y.o. female  was evaluated in triage.  Pt complains of abdominal pain, nausea, vomiting, diarrhea that began Saturday.  Patient states she has been unable to tolerate anything orally for the past few days.  She is diabetic and has not taken medication due to not being able to eat.  Her abdominal pain is described as being epigastric in nature.  The patient is currently on Mounjaro.  Review of Systems  Positive: Abdominal pain, nausea, vomiting, diarrhea Negative: Chest pain, shortness of breath  Physical Exam  There were no vitals taken for this visit. Gen:   Awake, no distress   Resp:  Normal effort  MSK:   Moves extremities without difficulty  Other:    Medical Decision Making  Medically screening exam initiated at 12:18 PM.  Appropriate orders placed.  Miranda Stevens was informed that the remainder of the evaluation will be completed by another provider, this initial triage assessment does not replace that evaluation, and the importance of remaining in the ED until their evaluation is complete.     Dorothyann Peng, PA-C 10/29/21 1219

## 2021-11-08 ENCOUNTER — Emergency Department (HOSPITAL_COMMUNITY)
Admission: EM | Admit: 2021-11-08 | Discharge: 2021-11-08 | Disposition: A | Discharge status: Home / Self Care | Payer: BC Managed Care – PPO | Attending: Emergency Medicine | Admitting: Emergency Medicine

## 2021-11-08 ENCOUNTER — Encounter (HOSPITAL_COMMUNITY): Payer: Self-pay

## 2021-11-08 ENCOUNTER — Other Ambulatory Visit: Payer: Self-pay

## 2021-11-08 DIAGNOSIS — R197 Diarrhea, unspecified: Secondary | ICD-10-CM | POA: Insufficient documentation

## 2021-11-08 DIAGNOSIS — R1013 Epigastric pain: Secondary | ICD-10-CM | POA: Insufficient documentation

## 2021-11-08 DIAGNOSIS — R112 Nausea with vomiting, unspecified: Secondary | ICD-10-CM | POA: Diagnosis not present

## 2021-11-08 DIAGNOSIS — D72829 Elevated white blood cell count, unspecified: Secondary | ICD-10-CM | POA: Diagnosis not present

## 2021-11-08 DIAGNOSIS — E119 Type 2 diabetes mellitus without complications: Secondary | ICD-10-CM | POA: Diagnosis not present

## 2021-11-08 DIAGNOSIS — Z7951 Long term (current) use of inhaled steroids: Secondary | ICD-10-CM | POA: Insufficient documentation

## 2021-11-08 DIAGNOSIS — R7989 Other specified abnormal findings of blood chemistry: Secondary | ICD-10-CM | POA: Insufficient documentation

## 2021-11-08 DIAGNOSIS — Z794 Long term (current) use of insulin: Secondary | ICD-10-CM | POA: Diagnosis not present

## 2021-11-08 DIAGNOSIS — J45909 Unspecified asthma, uncomplicated: Secondary | ICD-10-CM | POA: Insufficient documentation

## 2021-11-08 DIAGNOSIS — Z7984 Long term (current) use of oral hypoglycemic drugs: Secondary | ICD-10-CM | POA: Diagnosis not present

## 2021-11-08 LAB — COMPREHENSIVE METABOLIC PANEL
ALT: 28 U/L (ref 0–44)
AST: 15 U/L (ref 15–41)
Albumin: 4.8 g/dL (ref 3.5–5.0)
Alkaline Phosphatase: 83 U/L (ref 38–126)
Anion gap: 12 (ref 5–15)
BUN: 19 mg/dL (ref 6–20)
CO2: 21 mmol/L — ABNORMAL LOW (ref 22–32)
Calcium: 10.4 mg/dL — ABNORMAL HIGH (ref 8.9–10.3)
Chloride: 108 mmol/L (ref 98–111)
Creatinine, Ser: 1.1 mg/dL — ABNORMAL HIGH (ref 0.44–1.00)
GFR, Estimated: >60 mL/min (ref >60)
Glucose, Bld: 261 mg/dL — ABNORMAL HIGH (ref 70–99)
Potassium: 4.7 mmol/L (ref 3.5–5.1)
Sodium: 141 mmol/L (ref 135–145)
Total Bilirubin: 0.9 mg/dL (ref 0.3–1.2)
Total Protein: 8.7 g/dL — ABNORMAL HIGH (ref 6.5–8.1)

## 2021-11-08 LAB — CBC WITH DIFFERENTIAL/PLATELET
Abs Immature Granulocytes: 0.07 10*3/uL (ref 0.00–0.07)
Basophils Absolute: 0 10*3/uL (ref 0.0–0.1)
Basophils Relative: 0 %
Eosinophils Absolute: 0.3 10*3/uL (ref 0.0–0.5)
Eosinophils Relative: 2 %
HCT: 51.6 % — ABNORMAL HIGH (ref 36.0–46.0)
Hemoglobin: 16.7 g/dL — ABNORMAL HIGH (ref 12.0–15.0)
Immature Granulocytes: 1 %
Lymphocytes Relative: 6 %
Lymphs Abs: 0.9 10*3/uL (ref 0.7–4.0)
MCH: 28.6 pg (ref 26.0–34.0)
MCHC: 32.4 g/dL (ref 30.0–36.0)
MCV: 88.5 fL (ref 80.0–100.0)
Monocytes Absolute: 1.6 10*3/uL — ABNORMAL HIGH (ref 0.1–1.0)
Monocytes Relative: 11 %
Neutro Abs: 12.5 10*3/uL — ABNORMAL HIGH (ref 1.7–7.7)
Neutrophils Relative %: 80 %
Platelets: 184 10*3/uL (ref 150–400)
RBC: 5.83 MIL/uL — ABNORMAL HIGH (ref 3.87–5.11)
RDW: 12.7 % (ref 11.5–15.5)
WBC: 15.4 10*3/uL — ABNORMAL HIGH (ref 4.0–10.5)
nRBC: 0 % (ref 0.0–0.2)

## 2021-11-08 LAB — LIPASE, BLOOD: Lipase: 43 U/L (ref 11–51)

## 2021-11-08 MED ORDER — LACTATED RINGERS IV BOLUS
1000.0000 mL | Freq: Once | INTRAVENOUS | Status: AC
  Administered 2021-11-08: 1000 mL via INTRAVENOUS

## 2021-11-08 MED ORDER — ONDANSETRON HCL 4 MG/2ML IJ SOLN
4.0000 mg | Freq: Once | INTRAMUSCULAR | Status: AC
  Administered 2021-11-08: 4 mg via INTRAVENOUS
  Filled 2021-11-08: qty 2

## 2021-11-08 NOTE — ED Provider Notes (Signed)
[facility] DEPT Provider Note   CSN: [id number] Arrival date & time: 11/08/21  1231     History No chief complaint on file.   Ruhi Anni Hocevar is a 41 y.o. female with medical history significant for asthma, diabetes, hyperlipidemia, migraines.  Patient presents to the ED for evaluation of nausea, vomiting, diarrhea.  Patient states that last night and this morning she began experiencing nausea, vomiting and diarrhea.  Patient denies any blood in vomit or stool.  Patient reports a similar history of this.  Patient was seen on 5/31 for same complaint and at this time it was believed to be caused by her multiple diabetic medications.  These medications include Manjaro, metformin, Jardiance.  Patient reports last A1c was 12.  Patient states these injections are weekly however she has not received Manjaro injections since she was last seen in the ED. Patient denies any known sick contacts.  Patient denies any fevers, body aches or chills, chest pain, shortness of breath, blood in stool.  HPI     Home Medications Prior to Admission medications   Medication Sig Start Date End Date Taking? Authorizing Provider  acetaminophen (TYLENOL) 500 MG tablet Take 1,000 mg by mouth every 6 (six) hours as needed for mild pain, moderate pain or headache. Pain    [provider]  albuterol (VENTOLIN HFA) 108 (90 Base) MCG/ACT inhaler Inhale 1-2 puffs into the lungs every 6 (six) hours as needed for wheezing or shortness of breath. 06/06/20   Billie Ruddy, MD  benzonatate (TESSALON) 200 MG capsule Take 200 mg by mouth 3 (three) times daily as needed. 09/27/20   [provider]  Blood Glucose Monitoring Suppl ([place]) w/Device KIT 1 each by Does not apply route in the morning, at noon, and at bedtime. Use onetouch verio flex to check blood sugar three times daily.    [provider]  cetirizine (ZYRTEC) 10 MG tablet Take 1 tablet  (10 mg total) by mouth daily. 06/06/20   Billie Ruddy, MD  CINNAMON PO Take 1 tablet by mouth daily.     [provider]  clotrimazole (CLOTRIMAZOLE ANTI-FUNGAL) 1 % cream Apply 1 application topically 2 (two) times daily. 02/28/20   Tresea Mall, CNM  Continuous Blood Gluc Sensor (FREESTYLE LIBRE 2 SENSOR) MISC CHANGE SENSOR EVERY 14 DAYS. NEED TO SCHEDULE FOLLOW UP APPOINTMENT. 04/26/21   Elayne Snare, MD  Continuous Blood Gluc Transmit (DEXCOM G6 TRANSMITTER) MISC Use as instructed to check blood sugar daily 10/29/20   Elayne Snare, MD  Dulaglutide (TRULICITY) 1.5 AJ/6.8TL SOPN Inject 1.5 mg into the skin once a week.    [provider]  fluticasone (FLONASE) 50 MCG/ACT nasal spray Place 1 spray into both nostrils daily. 06/06/20   Billie Ruddy, MD  glucose blood test strip Use Onetouch verio test strips as instructed to check blood sugar 3 times daily. 06/21/20   Elayne Snare, MD  insulin degludec (TRESIBA FLEXTOUCH) 200 UNIT/ML FlexTouch Pen Inject 100 Units into the skin daily. Adjust dose as directed 04/08/21   Elayne Snare, MD  Insulin Pen Needle (PEN NEEDLES) 32G X 6 MM MISC 1 each by Subcutaneous Infusion route as directed. 06/21/20   Elayne Snare, MD  insulin regular human CONCENTRATED (HUMULIN R U-500 KWIKPEN) 500 UNIT/ML KwikPen 20 units on waking up, 40 units, 30 minutes before lunch and 60 units, 30 minutes before dinner 04/08/21   Elayne Snare, MD  metFORMIN (GLUCOPHAGE-XR) 500 MG 24  hr tablet TAKE 4 TABLETS BY MOUTH DAILY. NEED APPOINTMENT FOR FURTHER REFILLS. 04/08/21   Elayne Snare, MD  Misc. Devices (SITZ BATH) MISC 1 Device by Does not apply route as needed. 02/28/20   Marcille Buffy D, CNM  montelukast (SINGULAIR) 10 MG tablet Take 1 tablet by mouth daily. 09/27/20   [provider]  nystatin (MYCOSTATIN/NYSTOP) powder Apply 1 application topically 3 (three) times daily. 02/28/20   Tresea Mall, CNM  OneTouch Delica Lancets 59F MISC Use onetouch delica  lancets to check blood sugar 3 times daily. 10/03/19   Elayne Snare, MD  rizatriptan (MAXALT) 10 MG tablet TAKE 1 TABLET BY MOUTH AS NEEDED FOR MIGRAINE, MAY REPEAT IN 2 HOURS IF NEEDED. 02/21/21   Billie Ruddy, MD  rosuvastatin (CRESTOR) 40 MG tablet Take 1 tablet (40 mg total) by mouth daily. 06/21/20   Elayne Snare, MD      Allergies    Aspirin    Review of Systems   Review of Systems  Constitutional:  Negative for chills and fever.  Respiratory:  Negative for shortness of breath.   Cardiovascular:  Negative for chest pain.  Gastrointestinal:  Positive for abdominal pain, diarrhea, nausea and vomiting. Negative for blood in stool.  All other systems reviewed and are negative.   Physical Exam Updated Vital Signs BP (!) 141/72   Pulse (!) 107   Temp 97.6 F (36.4 C) (Oral)   Resp 15   Ht '5\' 11"'  (1.803 m)   Wt 131.5 kg   SpO2 96%   BMI 40.45 kg/m  Physical Exam Vitals and nursing note reviewed.  Constitutional:      General: She is not in acute distress.    Appearance: Normal appearance. She is obese. She is not ill-appearing, toxic-appearing or diaphoretic.  HENT:     Head: Normocephalic and atraumatic.     Nose: Nose normal.     Mouth/Throat:     Mouth: Mucous membranes are dry.     Pharynx: Oropharynx is clear.  Eyes:     Extraocular Movements: Extraocular movements intact.     Conjunctiva/sclera: Conjunctivae normal.     Pupils: Pupils are equal, round, and reactive to light.  Cardiovascular:     Rate and Rhythm: Normal rate and regular rhythm.  Pulmonary:     Effort: Pulmonary effort is normal.     Breath sounds: Normal breath sounds. No wheezing.  Abdominal:     General: Abdomen is flat. Bowel sounds are normal. There is no distension.     Palpations: Abdomen is soft.     Tenderness: There is abdominal tenderness in the epigastric area. There is no guarding.  Musculoskeletal:        General: Normal range of motion.     Cervical back: Normal range of motion  and neck supple.  Skin:    General: Skin is warm and dry.     Capillary Refill: Capillary refill takes less than 2 seconds.  Neurological:     Mental Status: She is alert and oriented to person, place, and time.     ED Results / Procedures / Treatments   Labs (all labs ordered are listed, but only abnormal results are displayed) Labs Reviewed  CBC WITH DIFFERENTIAL/PLATELET - Abnormal; Notable for the following components:      Result Value   WBC 15.4 (*)    RBC 5.83 (*)    Hemoglobin 16.7 (*)    HCT 51.6 (*)    Neutro Abs 12.5 (*)  Monocytes Absolute 1.6 (*)    All other components within normal limits  COMPREHENSIVE METABOLIC PANEL - Abnormal; Notable for the following components:   CO2 21 (*)    Glucose, Bld 261 (*)    Creatinine, Ser 1.10 (*)    Calcium 10.4 (*)    Total Protein 8.7 (*)    All other components within normal limits  LIPASE, BLOOD  URINALYSIS, ROUTINE W REFLEX MICROSCOPIC    EKG None  Radiology No results found.  Procedures Procedures   Medications Ordered in ED Medications  lactated ringers bolus 1,000 mL (1,000 mLs Intravenous New Bag/Given 11/08/21 1304)  ondansetron (ZOFRAN) injection 4 mg (4 mg Intravenous Given 11/08/21 1303)    ED Course/ Medical Decision Making/ A&P                           Medical Decision Making Amount and/or Complexity of Data Reviewed Labs: ordered.  Risk Prescription drug management.   41 year old female presents to the ED for evaluation.  Please see HPI for further details.  On examination, the patient is afebrile and tachycardic to 104.  The patient's abdomen is soft and compressible in all 4 quadrants however the patient does endorse epigastric abdominal tenderness.  There is no guarding or rebound pain.  The patient's lung sounds are clear bilaterally.  The patient is alert and orient x4.  The patient follows commands appropriately.  Patient worked up utilizing the following labs and imaging studies  interpreted by me personally: - Lipase unremarkable at 43 - CMP has elevated glucose of 261, elevated creatinine 1.1.  Patient treated with 1 L lactated ringer - CBC shows elevated white blood cell count of 15.4 however the patient is afebrile. - Urinalysis pending  Patient treated with 1 L lactated ringer, 4 mg Zofran.  Patient reports after these medications were administered she feels much better.  Shared decision-making conversation was had due to the patient's elevated white blood cell count.  I advised patient that if she felt strongly about it we could consider CT imaging, the patient stated that with the IV fluids and antinausea medication she felt much better.  The patient denied wishing to proceed with CT imaging at this time.  The patient was advised that if she returns home, and her symptoms worsen or return that she should return to the ED for further management and possible CT imaging.  The patient voiced understanding with this.  The patient was also given other return precautions which she voiced understanding with.  Patient had all of her questions answered to her satisfaction.  The patient is stable this time for discharge home  Final Clinical Impression(s) / ED Diagnoses Final diagnoses:  Nausea vomiting and diarrhea    Rx / DC Orders ED Discharge Orders     None         Azucena Cecil, PA-C 11/08/21 1508    Tegeler, Gwenyth Allegra, MD 11/09/21 [id number]

## 2021-11-08 NOTE — ED Triage Notes (Signed)
Patient BIB EMS from home c/o N/V/D since this morning. Patient states this happened two weeks ago. Patient was seen by PCP. PCP took patient off moungaro. Patient states epigastric pain is still ongoing.

## 2021-11-08 NOTE — Discharge Instructions (Signed)
Please return to the ED with any new symptoms or return of symptoms such as increased abdominal pain, fevers, nausea or vomiting Please continue hydrating yourself at home.  Please utilize coconut water, water, Pedialyte Please read the attached informational guide concerning nausea and vomiting in adults Please continue taking Zofran for nausea at home. Please follow-up with your PCP next Monday as we discussed

## 2021-12-16 ENCOUNTER — Ambulatory Visit (HOSPITAL_COMMUNITY)
Admission: EM | Admit: 2021-12-16 | Discharge: 2021-12-16 | Disposition: A | Discharge status: Home / Self Care | Payer: BC Managed Care – PPO | Attending: Physician Assistant | Admitting: Physician Assistant

## 2021-12-16 ENCOUNTER — Encounter (HOSPITAL_COMMUNITY): Payer: Self-pay | Admitting: Emergency Medicine

## 2021-12-16 DIAGNOSIS — R350 Frequency of micturition: Secondary | ICD-10-CM | POA: Diagnosis not present

## 2021-12-16 LAB — POCT URINALYSIS DIPSTICK, ED / UC
Bilirubin Urine: NEGATIVE
Glucose, UA: >=1000 mg/dL — AB
Ketones, ur: NEGATIVE mg/dL
Leukocytes,Ua: NEGATIVE
Nitrite: NEGATIVE
Protein, ur: NEGATIVE mg/dL
Specific Gravity, Urine: 1.01 (ref 1.005–1.030)
Urobilinogen, UA: 0.2 mg/dL (ref 0.0–1.0)
pH: 5.5 (ref 5.0–8.0)

## 2021-12-16 LAB — CBG MONITORING, ED: Glucose-Capillary: 250 mg/dL — ABNORMAL HIGH (ref 70–99)

## 2021-12-16 NOTE — ED Triage Notes (Signed)
Patient c/o mid lower ABD pain and RT lower back pain 8-10 days.   Patient denies vaginal discharge or fever.   Patient endorses nausea at times.   Patient endorses urinary frequency . Patient endorses going to the bathroom 7-8 times at night.   Patient endorses being seen in the ED 5/31 and being treated for UTI and receiving Macrobid with resolutions of symptoms until now.   History of DM.

## 2021-12-16 NOTE — ED Provider Notes (Signed)
[place]    CSN: [id number] Arrival date & time: 12/16/21  1929      History   Chief Complaint Chief Complaint  Patient presents with   Abdominal Pain   Back Pain    HPI Miranda Stevens is a 41 y.o. female.   Pt complains of lower abdominal pressure and increased urinary frequency that started 8-10 days ago.  She reports she was treated for UTI 5/31 with resolution of symptoms.  Today's sx are similar to previous UTI sx.  She denies fever, chills, flank pain, vaginal discharge, pelvic pain.  She has tried nothing for her sx.  H/o DM II.     Past Medical History:  Diagnosis Date   Asthma    Diabetes mellitus without complication (HCC)    TKWIOXBD(532.9)    High cholesterol    Migraine    Migraine    Pericarditis    Polycystic disease, ovaries     Patient Active Problem List   Diagnosis Date Noted   PCOS (polycystic ovarian syndrome) 02/24/2018   Uncontrolled diabetes mellitus type 2 without complications 92/42/6834   Mild intermittent asthma without complication 19/62/2297   History of migraine 11/01/2017   Seasonal allergies 11/01/2017   Snoring 11/01/2017   General counseling for prescription of oral contraceptives 04/04/2012   Excessive or frequent menstruation 04/04/2012    Past Surgical History:  Procedure Laterality Date   BIOPSY ENDOMETRIAL      OB History     Gravida  0   Para      Term      Preterm      AB      Living  0      SAB      IAB      Ectopic      Multiple      Live Births               Home Medications    Prior to Admission medications   Medication Sig Start Date End Date Taking? Authorizing Provider  ezetimibe (ZETIA) 10 MG tablet Take 10 mg by mouth daily. 09/29/21  Yes [provider]  insulin degludec (TRESIBA FLEXTOUCH) 200 UNIT/ML FlexTouch Pen Inject 100 Units into the skin daily. Adjust dose as directed 04/08/21  Yes Elayne Snare, MD  Insulin Pen Needle (PEN NEEDLES) 32G X 6  MM MISC 1 each by Subcutaneous Infusion route as directed. 06/21/20  Yes Elayne Snare, MD  insulin regular human CONCENTRATED (HUMULIN R U-500 KWIKPEN) 500 UNIT/ML KwikPen 20 units on waking up, 40 units, 30 minutes before lunch and 60 units, 30 minutes before dinner 04/08/21  Yes Elayne Snare, MD  JARDIANCE 25 MG TABS tablet Take 25 mg by mouth daily. 11/13/21  Yes [provider]  metFORMIN (GLUCOPHAGE-XR) 500 MG 24 hr tablet TAKE 4 TABLETS BY MOUTH DAILY. NEED APPOINTMENT FOR FURTHER REFILLS. 04/08/21  Yes Elayne Snare, MD  OZEMPIC, 0.25 OR 0.5 MG/DOSE, 2 MG/3ML SOPN Inject 0.5 mg into the skin once a week. 12/05/21  Yes [provider]  rosuvastatin (CRESTOR) 40 MG tablet Take 1 tablet (40 mg total) by mouth daily. 06/21/20  Yes Elayne Snare, MD  acetaminophen (TYLENOL) 500 MG tablet Take 1,000 mg by mouth every 6 (six) hours as needed for mild pain, moderate pain or headache. Pain    [provider]  albuterol (VENTOLIN HFA) 108 (90 Base) MCG/ACT inhaler Inhale 1-2 puffs into the lungs every 6 (six) hours as needed for  wheezing or shortness of breath. 06/06/20   Billie Ruddy, MD  benzonatate (TESSALON) 200 MG capsule Take 200 mg by mouth 3 (three) times daily as needed. 09/27/20   [provider]  Blood Glucose Monitoring Suppl ([place]) w/Device KIT 1 each by Does not apply route in the morning, at noon, and at bedtime. Use onetouch verio flex to check blood sugar three times daily.    [provider]  cetirizine (ZYRTEC) 10 MG tablet Take 1 tablet (10 mg total) by mouth daily. 06/06/20   Billie Ruddy, MD  CINNAMON PO Take 1 tablet by mouth daily.     [provider]  clotrimazole (CLOTRIMAZOLE ANTI-FUNGAL) 1 % cream Apply 1 application topically 2 (two) times daily. 02/28/20   Tresea Mall, CNM  Continuous Blood Gluc Sensor (FREESTYLE LIBRE 2 SENSOR) MISC CHANGE SENSOR EVERY 14 DAYS. NEED TO SCHEDULE FOLLOW UP APPOINTMENT.  04/26/21   Elayne Snare, MD  Continuous Blood Gluc Transmit (DEXCOM G6 TRANSMITTER) MISC Use as instructed to check blood sugar daily 10/29/20   Elayne Snare, MD  Dulaglutide (TRULICITY) 1.5 MH/6.8GS SOPN Inject 1.5 mg into the skin once a week.    [provider]  fluticasone (FLONASE) 50 MCG/ACT nasal spray Place 1 spray into both nostrils daily. 06/06/20   Billie Ruddy, MD  glucose blood test strip Use Onetouch verio test strips as instructed to check blood sugar 3 times daily. 06/21/20   Elayne Snare, MD  medroxyPROGESTERone Acetate 150 MG/ML SUSY SMARTSIG:1 Syringe(s) IM 09/08/21   [provider]  Misc. Devices (SITZ BATH) MISC 1 Device by Does not apply route as needed. 02/28/20   Marcille Buffy D, CNM  montelukast (SINGULAIR) 10 MG tablet Take 1 tablet by mouth daily. 09/27/20   [provider]  nystatin (MYCOSTATIN/NYSTOP) powder Apply 1 application topically 3 (three) times daily. 02/28/20   Tresea Mall, CNM  OneTouch Delica Lancets 81J MISC Use onetouch delica lancets to check blood sugar 3 times daily. 10/03/19   Elayne Snare, MD  rizatriptan (MAXALT) 10 MG tablet TAKE 1 TABLET BY MOUTH AS NEEDED FOR MIGRAINE, MAY REPEAT IN 2 HOURS IF NEEDED. 02/21/21   Billie Ruddy, MD    Family History Family History  Problem Relation Age of Onset   Lung disease Mother    Diabetes Mother    Heart disease Mother    Hyperlipidemia Father     Social History Social History   Tobacco Use   Smoking status: Former    Years: 10.00    Types: Cigarettes    Quit date: 01/31/2011    Years since quitting: 10.8   Smokeless tobacco: Never  Substance Use Topics   Alcohol use: Not Currently   Drug use: Not Currently    Comment: laswt used 1 week ago     Allergies   Aspirin   Review of Systems Review of Systems  Constitutional:  Negative for chills and fever.  HENT:  Negative for ear pain and sore throat.   Eyes:  Negative for pain and visual disturbance.   Respiratory:  Negative for cough and shortness of breath.   Cardiovascular:  Negative for chest pain and palpitations.  Gastrointestinal:  Positive for abdominal pain. Negative for vomiting.  Genitourinary:  Positive for frequency and urgency. Negative for dysuria and hematuria.  Musculoskeletal:  Negative for arthralgias and back pain.  Skin:  Negative for color change and rash.  Neurological:  Negative for seizures and syncope.  All other systems reviewed and are negative.    Physical Exam Triage Vital Signs ED Triage Vitals  Enc Vitals Group     BP 12/16/21 1937 122/84     Pulse Rate 12/16/21 1937 79     Resp 12/16/21 1937 12     Temp 12/16/21 1937 97.8 F (36.6 C)     Temp Source 12/16/21 1937 Oral     SpO2 12/16/21 1937 96 %     Weight --      Height --      Head Circumference --      Peak Flow --      Pain Score 12/16/21 1943 4     Pain Loc --      Pain Edu? --      Excl. in [place]? --    No data found.  Updated Vital Signs BP 122/84 (BP Location: Right Arm)   Pulse 79   Temp 97.8 F (36.6 C) (Oral)   Resp 12   LMP  (LMP Unknown)   SpO2 96%   Visual Acuity Right Eye Distance:   Left Eye Distance:   Bilateral Distance:    Right Eye Near:   Left Eye Near:    Bilateral Near:     Physical Exam Vitals and nursing note reviewed.  Constitutional:      General: She is not in acute distress.    Appearance: She is well-developed.  HENT:     Head: Normocephalic and atraumatic.  Eyes:     Conjunctiva/sclera: Conjunctivae normal.  Cardiovascular:     Rate and Rhythm: Normal rate and regular rhythm.     Heart sounds: No murmur heard. Pulmonary:     Effort: Pulmonary effort is normal. No respiratory distress.     Breath sounds: Normal breath sounds.  Abdominal:     Palpations: Abdomen is soft.     Tenderness: There is no abdominal tenderness.  Musculoskeletal:        General: No swelling.     Cervical back: Neck supple.  Skin:    General: Skin is warm and  dry.     Capillary Refill: Capillary refill takes less than 2 seconds.  Neurological:     Mental Status: She is alert.  Psychiatric:        Mood and Affect: Mood normal.      UC Treatments / Results  Labs (all labs ordered are listed, but only abnormal results are displayed) Labs Reviewed  POCT URINALYSIS DIPSTICK, ED / UC - Abnormal; Notable for the following components:      Result Value   Glucose, UA >=1000 (*)    Hgb urine dipstick TRACE (*)    All other components within normal limits  CBG MONITORING, ED - Abnormal; Notable for the following components:   Glucose-Capillary 250 (*)    All other components within normal limits    EKG   Radiology No results found.  Procedures Procedures (including critical care time)  Medications Ordered in UC Medications - No data to display  Initial Impression / Assessment and Plan / UC Course  I have reviewed the triage vital signs and the nursing notes.  Pertinent labs & imaging results that were available during my care of the patient were reviewed by me and considered in my medical decision making (see chart for details).     UA with no signs of UTI.  Glucose greater than 1000 in urine, CBG 250.  Discussed poorly controlled glucose which is likely the  cause of increased urinary frequency. She reports she does have DM medications.  She will follow up with PCP.  ED precautions given.  Pt stable for discharge.  Final Clinical Impressions(s) / UC Diagnoses   Final diagnoses:  Increased urinary frequency     Discharge Instructions      Your urine does not show infection Make sure you are managing your sugars appropriately Follow up with primary care physician   ED Prescriptions   None    PDMP not reviewed this encounter.   Ward, Lenise Arena, PA-C 12/26/21 1630

## 2021-12-16 NOTE — Discharge Instructions (Signed)
Your urine does not show infection Make sure you are managing your sugars appropriately Follow up with primary care physician

## 2023-06-28 ENCOUNTER — Ambulatory Visit (HOSPITAL_COMMUNITY)
Admission: RE | Admit: 2023-06-28 | Discharge: 2023-06-28 | Disposition: A | Discharge status: Home / Self Care | Payer: BC Managed Care – PPO | Source: Ambulatory Visit

## 2023-06-28 ENCOUNTER — Encounter (HOSPITAL_COMMUNITY): Payer: Self-pay

## 2023-06-28 VITALS — BP 136/84 | HR 81 | Temp 98.3°F | Resp 18

## 2023-06-28 DIAGNOSIS — Z5189 Encounter for other specified aftercare: Secondary | ICD-10-CM

## 2023-06-28 DIAGNOSIS — Z4802 Encounter for removal of sutures: Secondary | ICD-10-CM

## 2023-06-28 HISTORY — DX: Type 2 diabetes mellitus without complications: E11.9

## 2023-06-28 HISTORY — DX: Cutaneous abscess, unspecified: L02.91

## 2023-06-28 HISTORY — DX: Sepsis, unspecified organism: A41.9

## 2023-06-28 NOTE — ED Triage Notes (Signed)
[place] had a cyst drained which turned into an right sided perineal abscess.  The Sunday before Christmas she went to the hospital had emergency surgery for the abscess. 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   She went to surgeon week after released and before coming to [facility] and he said the site looked good and he wanted the site to be looked at and removed at today.   She is still having pain at the sight.   Home Interventions: Tylenol, with some relief

## 2023-06-28 NOTE — Discharge Instructions (Addendum)
Your surgical site looks well healed. Please make sure you follow up with your PCP and surgeon for continued monitoring and management. If you develop drainage, erythema, swelling, increased pain or bleeding from the area, please go to the ED for prompt care.

## 2023-06-28 NOTE — ED Provider Notes (Addendum)
[facility]    CSN: [id number] Arrival date & time: 06/28/23  1716      History   Chief Complaint Chief Complaint  Patient presents with   Wound Check   Suture / Staple Removal    HPI Miranda Stevens is a 43 y.o. female.   HPI   Wound check with suture/staple removal needed  She reports about a week before Christmas she had to have emergency surgery along the area of her abscess  She reports she was kept in hospital for about 9 days and wound was closed after about 7 days  She was sent home on abx from infectious disease- has completed course of levofloxacin  She thinks there may have been a second abx but she is not sure what it was  The wound is along the right side of her buttocks  She reports some tenderness and pain in the area - more soreness in the area She denies current drainage or bleeding since the closure.  She reports her surgeon has seen the wound about a week after closure and wanted suture/staples removed around 06/28/23      Past Medical History:  Diagnosis Date   Abscess    Asthma    Diabetes (HCC)    Diabetes mellitus without complication (HCC)    Headache(784.0)    High cholesterol    Migraine    Migraine    Pericarditis    Polycystic disease, ovaries    Sepsis (HCC)     Patient Active Problem List   Diagnosis Date Noted   PCOS (polycystic ovarian syndrome) 02/24/2018   Uncontrolled diabetes mellitus type 2 without complications 11/01/2017   Mild intermittent asthma without complication 11/01/2017   History of migraine 11/01/2017   Seasonal allergies 11/01/2017   Snoring 11/01/2017   General counseling for prescription of oral contraceptives 04/04/2012   Excessive or frequent menstruation 04/04/2012    Past Surgical History:  Procedure Laterality Date   ABSCESS DRAINAGE     BIOPSY ENDOMETRIAL      OB History     Gravida  0   Para      Term      Preterm      AB      Living  0      SAB      IAB       Ectopic      Multiple      Live Births               Home Medications    Prior to Admission medications   Medication Sig Start Date End Date Taking? Authorizing Provider  albuterol (VENTOLIN HFA) 108 (90 Base) MCG/ACT inhaler Inhale 1-2 puffs into the lungs every 6 (six) hours as needed for wheezing or shortness of breath. 06/06/20  Yes Deeann Saint, MD  buPROPion (WELLBUTRIN SR) 150 MG 12 hr tablet Take 150 mg by mouth every morning. 02/08/23  Yes [provider]  busPIRone (BUSPAR) 5 MG tablet TAKE 1 TABLET BY MOUTH TWICE DAILY FOR ANXIETY   Yes [provider]  Cholecalciferol 1.25 MG (50000 UT) capsule Take 1 capsule by mouth once a week.   Yes [provider]  CINNAMON PO Take 1 tablet by mouth daily.    Yes [provider]  Continuous Blood Gluc Transmit (DEXCOM G6 TRANSMITTER) MISC Use as instructed to check blood sugar daily 10/29/20  Yes Reather Littler, MD  gabapentin (NEURONTIN) 300 MG capsule Take 300  mg by mouth 3 (three) times daily.   Yes [provider]  glucose blood test strip Use Onetouch verio test strips as instructed to check blood sugar 3 times daily. 06/21/20  Yes Reather Littler, MD  Insulin Glargine (SEMGLEE [place]) Inject into the skin.   Yes [provider]  Insulin Pen Needle (PEN NEEDLES) 32G X 6 MM MISC 1 each by Subcutaneous Infusion route as directed. 06/21/20  Yes Reather Littler, MD  insulin regular (NOVOLIN R) 100 units/mL injection Inject into the skin 3 (three) times daily before meals.   Yes [provider]  metFORMIN (GLUCOPHAGE-XR) 500 MG 24 hr tablet Take 500 mg by mouth daily with breakfast.   Yes [provider]  OneTouch Delica Lancets 30G MISC Use onetouch delica lancets to check blood sugar 3 times daily. 10/03/19  Yes Reather Littler, MD  rizatriptan (MAXALT) 10 MG tablet TAKE 1 TABLET BY MOUTH AS NEEDED FOR MIGRAINE, MAY REPEAT IN 2 HOURS IF NEEDED. 02/21/21  Yes Deeann Saint, MD   rosuvastatin (CRESTOR) 40 MG tablet Take 1 tablet (40 mg total) by mouth daily. 06/21/20  Yes Reather Littler, MD  acetaminophen (TYLENOL) 500 MG tablet Take 1,000 mg by mouth every 6 (six) hours as needed for mild pain, moderate pain or headache. Pain    [provider]  Blood Glucose Monitoring Suppl (ONETOUCH VERIO FLEX SYSTEM) w/Device KIT 1 each by Does not apply route in the morning, at noon, and at bedtime. Use onetouch verio flex to check blood sugar three times daily.    [provider]  cetirizine (ZYRTEC) 10 MG tablet Take 1 tablet (10 mg total) by mouth daily. 06/06/20   Deeann Saint, MD  Continuous Blood Gluc Sensor (FREESTYLE LIBRE 2 SENSOR) MISC CHANGE SENSOR EVERY 14 DAYS. NEED TO SCHEDULE FOLLOW UP APPOINTMENT. 04/26/21   Reather Littler, MD    Family History Family History  Problem Relation Age of Onset   Lung disease Mother    Diabetes Mother    Heart disease Mother    Hyperlipidemia Father     Social History Social History   Tobacco Use   Smoking status: Former    Current packs/day: 0.00    Types: Cigarettes    Start date: 01/30/2001    Quit date: 01/31/2011    Years since quitting: 12.4   Smokeless tobacco: Never  Substance Use Topics   Alcohol use: Not Currently   Drug use: Not Currently    Comment: laswt used 1 week ago     Allergies   Aspirin   Review of Systems Review of Systems  Skin:  Positive for wound.     Physical Exam Triage Vital Signs ED Triage Vitals  Encounter Vitals Group     BP 06/28/23 1800 136/84     Systolic BP Percentile --      Diastolic BP Percentile --      Pulse Rate 06/28/23 1800 81     Resp 06/28/23 1800 18     Temp 06/28/23 1800 98.3 F (36.8 C)     Temp Source 06/28/23 1800 Oral     SpO2 06/28/23 1800 96 %     Weight --      Height --      Head Circumference --      Peak Flow --      Pain Score 06/28/23 1750 3     Pain Loc --      Pain Education --  Exclude from Growth Chart --    No data  found.  Updated Vital Signs BP 136/84 (BP Location: Left Arm)   Pulse 81   Temp 98.3 F (36.8 C) (Oral)   Resp 18   SpO2 96%   Visual Acuity Right Eye Distance:   Left Eye Distance:   Bilateral Distance:    Right Eye Near:   Left Eye Near:    Bilateral Near:     Physical Exam Vitals reviewed.  Constitutional:      Appearance: Normal appearance.  HENT:     Head: Normocephalic and atraumatic.  Pulmonary:     Effort: Pulmonary effort is normal.  Musculoskeletal:     Cervical back: Normal range of motion.  Skin:    General: Skin is warm and dry.          Comments: Chaperoned by [place]    Neurological:     General: No focal deficit present.     Mental Status: She is alert and oriented to person, place, and time.  Psychiatric:        Mood and Affect: Mood normal.        Behavior: Behavior normal.        Thought Content: Thought content normal.        Judgment: Judgment normal.      UC Treatments / Results  Labs (all labs ordered are listed, but only abnormal results are displayed) Labs Reviewed - No data to display  EKG   Radiology No results found.  Procedures Procedures (including critical care time)  Medications Ordered in UC Medications - No data to display  Initial Impression / Assessment and Plan / UC Course  I have reviewed the triage vital signs and the nursing notes.  Pertinent labs & imaging results that were available during my care of the patient were reviewed by me and considered in my medical decision making (see chart for details).      Final Clinical Impressions(s) / UC Diagnoses   Final diagnoses:  Visit for wound check  Encounter for removal of sutures   Wound appears well healed and sutures are appropriate for removal. Nursing staff removed 13 sutures from site without complications. Minimal irritation or bleeding noted.  Reviewed follow up with patient. She should see PCP and surgeon in the coming days for routine follow  up and monitoring. She was educated on wound management and home care as well as return/ ED precautions.  She voiced understanding and agreement with plan.   Discharge Instructions   None    ED Prescriptions   None    PDMP not reviewed this encounter.   Providence Crosby, PA-C 06/28/23 1912    Arman Loy, Oswaldo Conroy, PA-C 06/28/23 1951    Krishawna Stiefel, Oswaldo Conroy, PA-C 06/28/23 2002

## 2023-12-17 ENCOUNTER — Encounter: Payer: Self-pay | Admitting: Advanced Practice Midwife
